# Patient Record
Sex: Female | Born: 1935 | Race: White | Hispanic: No | Marital: Married | Smoking: Former smoker
Health system: Southern US, Community
[De-identification: ages and names within clinical notes are randomized; demographics above are authoritative.]

## PROBLEM LIST (undated history)

## (undated) DIAGNOSIS — J40 Bronchitis, not specified as acute or chronic: Secondary | ICD-10-CM

## (undated) DIAGNOSIS — M1990 Unspecified osteoarthritis, unspecified site: Secondary | ICD-10-CM

## (undated) DIAGNOSIS — Z9989 Dependence on other enabling machines and devices: Secondary | ICD-10-CM

## (undated) DIAGNOSIS — R0602 Shortness of breath: Secondary | ICD-10-CM

## (undated) DIAGNOSIS — K449 Diaphragmatic hernia without obstruction or gangrene: Secondary | ICD-10-CM

## (undated) DIAGNOSIS — Z9981 Dependence on supplemental oxygen: Secondary | ICD-10-CM

## (undated) DIAGNOSIS — K219 Gastro-esophageal reflux disease without esophagitis: Secondary | ICD-10-CM

## (undated) DIAGNOSIS — L0390 Cellulitis, unspecified: Secondary | ICD-10-CM

## (undated) DIAGNOSIS — I10 Essential (primary) hypertension: Secondary | ICD-10-CM

## (undated) DIAGNOSIS — J189 Pneumonia, unspecified organism: Secondary | ICD-10-CM

## (undated) DIAGNOSIS — R0902 Hypoxemia: Secondary | ICD-10-CM

## (undated) HISTORY — PX: JOINT REPLACEMENT: SHX530

## (undated) HISTORY — PX: COLONOSCOPY: SHX174

## (undated) HISTORY — PX: CARPAL TUNNEL RELEASE: SHX101

## (undated) HISTORY — PX: HIP ARTHROPLASTY: SHX981

## (undated) HISTORY — PX: OVARIAN CYST REMOVAL: SHX89

## (undated) HISTORY — PX: BACK SURGERY: SHX140

## (undated) HISTORY — PX: ROTATOR CUFF REPAIR: SHX139

## (undated) HISTORY — PX: FRACTURE SURGERY: SHX138

## (undated) HISTORY — PX: VEIN SURGERY: SHX48

---

## 2008-12-19 DIAGNOSIS — R0902 Hypoxemia: Secondary | ICD-10-CM

## 2008-12-19 HISTORY — DX: Hypoxemia: R09.02

## 2011-09-22 ENCOUNTER — Other Ambulatory Visit: Payer: Self-pay | Admitting: Neurosurgery

## 2011-09-22 DIAGNOSIS — M545 Low back pain: Secondary | ICD-10-CM

## 2011-09-29 ENCOUNTER — Ambulatory Visit
Admission: RE | Admit: 2011-09-29 | Discharge: 2011-09-29 | Disposition: A | Discharge status: Home / Self Care | Payer: Medicare Other | Source: Ambulatory Visit | Attending: Neurosurgery | Admitting: Neurosurgery

## 2011-09-29 DIAGNOSIS — M545 Low back pain: Secondary | ICD-10-CM

## 2011-11-04 ENCOUNTER — Encounter (HOSPITAL_COMMUNITY): Payer: Self-pay | Admitting: Pharmacist

## 2011-11-07 ENCOUNTER — Encounter (HOSPITAL_COMMUNITY): Payer: Self-pay

## 2011-11-07 ENCOUNTER — Encounter (HOSPITAL_COMMUNITY)
Admission: RE | Admit: 2011-11-07 | Discharge: 2011-11-07 | Disposition: A | Discharge status: Home / Self Care | Payer: Medicare Other | Source: Ambulatory Visit | Attending: Anesthesiology | Admitting: Anesthesiology

## 2011-11-07 ENCOUNTER — Encounter (HOSPITAL_COMMUNITY)
Admission: RE | Admit: 2011-11-07 | Discharge: 2011-11-07 | Disposition: A | Discharge status: Home / Self Care | Payer: Medicare Other | Source: Ambulatory Visit | Attending: Neurosurgery | Admitting: Neurosurgery

## 2011-11-07 ENCOUNTER — Other Ambulatory Visit: Payer: Self-pay

## 2011-11-07 HISTORY — DX: Diaphragmatic hernia without obstruction or gangrene: K44.9

## 2011-11-07 HISTORY — DX: Gastro-esophageal reflux disease without esophagitis: K21.9

## 2011-11-07 HISTORY — DX: Hypoxemia: R09.02

## 2011-11-07 HISTORY — DX: Unspecified osteoarthritis, unspecified site: M19.90

## 2011-11-07 HISTORY — DX: Shortness of breath: R06.02

## 2011-11-07 HISTORY — DX: Essential (primary) hypertension: I10

## 2011-11-07 LAB — DIFFERENTIAL
Basophils Absolute: 0 10*3/uL (ref 0.0–0.1)
Basophils Relative: 1 % (ref 0–1)
Eosinophils Relative: 4 % (ref 0–5)
Monocytes Absolute: 0.7 10*3/uL (ref 0.1–1.0)
Monocytes Relative: 10 % (ref 3–12)

## 2011-11-07 LAB — SURGICAL PCR SCREEN
MRSA, PCR: NEGATIVE
Staphylococcus aureus: NEGATIVE

## 2011-11-07 LAB — ABO/RH: ABO/RH(D): A POS

## 2011-11-07 LAB — CBC
HCT: 36.9 % (ref 36.0–46.0)
MCHC: 32.5 g/dL (ref 30.0–36.0)
MCV: 95.1 fL (ref 78.0–100.0)
RDW: 12.9 % (ref 11.5–15.5)

## 2011-11-07 NOTE — Pre-Procedure Instructions (Signed)
20 Kristine [name]  11/07/2011   Your procedure is scheduled on:  11/14/2011 Monday  Report to [facility] Stay [facility] at 0530 AM.  Call this number if you have problems the morning of surgery: [phone]   Remember:   Do not eat food:After Midnight.  Do not drink clear liquids: 4 Hours before arrival.  Take these medicines the morning of surgery with A SIP OF WATER: Omeprazole,zoloft   Do not wear jewelry, make-up or nail polish.  Do not wear lotions, powders, or perfumes. You may wear deodorant.  Do not shave 48 hours prior to surgery.  Do not bring valuables to the hospital.  Contacts, dentures or bridgework may not be worn into surgery.  Leave suitcase in the car. After surgery it may be brought to your room.  For patients admitted to the hospital, checkout time is 11:00 AM the day of discharge.   Patients discharged the day of surgery will not be allowed to drive home.  Name and phone number of your driver: family  Special Instructions: CHG Shower Use Special Wash: 1/2 bottle night before surgery and 1/2 bottle morning of surgery.   Please read over the following fact sheets that you were given: Pain Booklet, Coughing and Deep Breathing, Blood Transfusion Information, MRSA Information and Surgical Site Infection Prevention

## 2011-11-14 ENCOUNTER — Inpatient Hospital Stay (HOSPITAL_COMMUNITY): Payer: Medicare Other

## 2011-11-14 ENCOUNTER — Inpatient Hospital Stay (HOSPITAL_COMMUNITY): Payer: Medicare Other | Admitting: Certified Registered"

## 2011-11-14 ENCOUNTER — Encounter (HOSPITAL_COMMUNITY): Payer: Self-pay | Admitting: Certified Registered"

## 2011-11-14 ENCOUNTER — Encounter (HOSPITAL_COMMUNITY): Payer: Self-pay | Admitting: *Deleted

## 2011-11-14 ENCOUNTER — Encounter (HOSPITAL_COMMUNITY)
Admission: RE | Disposition: A | Discharge status: Home / Self Care | Payer: Self-pay | Source: Ambulatory Visit | Attending: Neurosurgery

## 2011-11-14 ENCOUNTER — Inpatient Hospital Stay (HOSPITAL_COMMUNITY)
Admission: RE | Admit: 2011-11-14 | Discharge: 2011-11-18 | DRG: 457 | Disposition: A | Discharge status: Home / Self Care | Payer: Medicare Other | Source: Ambulatory Visit | Attending: Neurosurgery | Admitting: Neurosurgery

## 2011-11-14 DIAGNOSIS — K449 Diaphragmatic hernia without obstruction or gangrene: Secondary | ICD-10-CM | POA: Diagnosis present

## 2011-11-14 DIAGNOSIS — K219 Gastro-esophageal reflux disease without esophagitis: Secondary | ICD-10-CM | POA: Diagnosis present

## 2011-11-14 DIAGNOSIS — I10 Essential (primary) hypertension: Secondary | ICD-10-CM | POA: Diagnosis present

## 2011-11-14 DIAGNOSIS — M4180 Other forms of scoliosis, site unspecified: Principal | ICD-10-CM | POA: Diagnosis present

## 2011-11-14 DIAGNOSIS — D62 Acute posthemorrhagic anemia: Secondary | ICD-10-CM | POA: Diagnosis not present

## 2011-11-14 DIAGNOSIS — I9589 Other hypotension: Secondary | ICD-10-CM | POA: Diagnosis not present

## 2011-11-14 DIAGNOSIS — M4310 Spondylolisthesis, site unspecified: Secondary | ICD-10-CM | POA: Diagnosis present

## 2011-11-14 DIAGNOSIS — Z79899 Other long term (current) drug therapy: Secondary | ICD-10-CM

## 2011-11-14 DIAGNOSIS — E669 Obesity, unspecified: Secondary | ICD-10-CM | POA: Diagnosis present

## 2011-11-14 DIAGNOSIS — Z01812 Encounter for preprocedural laboratory examination: Secondary | ICD-10-CM

## 2011-11-14 DIAGNOSIS — Z96659 Presence of unspecified artificial knee joint: Secondary | ICD-10-CM

## 2011-11-14 DIAGNOSIS — M5137 Other intervertebral disc degeneration, lumbosacral region: Secondary | ICD-10-CM | POA: Diagnosis present

## 2011-11-14 DIAGNOSIS — J45909 Unspecified asthma, uncomplicated: Secondary | ICD-10-CM | POA: Diagnosis present

## 2011-11-14 DIAGNOSIS — M51379 Other intervertebral disc degeneration, lumbosacral region without mention of lumbar back pain or lower extremity pain: Secondary | ICD-10-CM | POA: Diagnosis present

## 2011-11-14 HISTORY — DX: Dependence on supplemental oxygen: Z99.81

## 2011-11-14 LAB — DIFFERENTIAL
Eosinophils Absolute: 0.1 10*3/uL (ref 0.0–0.7)
Lymphocytes Relative: 8 % — ABNORMAL LOW (ref 12–46)
Lymphs Abs: 1.4 10*3/uL (ref 0.7–4.0)
Monocytes Relative: 2 % — ABNORMAL LOW (ref 3–12)
Neutrophils Relative %: 89 % — ABNORMAL HIGH (ref 43–77)

## 2011-11-14 LAB — BASIC METABOLIC PANEL
BUN: 17 mg/dL (ref 6–23)
CO2: 23 mEq/L (ref 19–32)
Chloride: 108 mEq/L (ref 96–112)
Glucose, Bld: 182 mg/dL — ABNORMAL HIGH (ref 70–99)
Potassium: 5.4 mEq/L — ABNORMAL HIGH (ref 3.5–5.1)
Sodium: 138 mEq/L (ref 135–145)

## 2011-11-14 LAB — CBC
Hemoglobin: 9.6 g/dL — ABNORMAL LOW (ref 12.0–15.0)
MCH: 31.1 pg (ref 26.0–34.0)
Platelets: 231 10*3/uL (ref 150–400)
RBC: 3.09 MIL/uL — ABNORMAL LOW (ref 3.87–5.11)
WBC: 16.4 10*3/uL — ABNORMAL HIGH (ref 4.0–10.5)

## 2011-11-14 SURGERY — POSTERIOR LUMBAR FUSION 3 LEVEL
Anesthesia: General | Site: Spine Lumbar | Laterality: Bilateral | Wound class: Clean

## 2011-11-14 MED ORDER — ONDANSETRON HCL 4 MG/2ML IJ SOLN: 4.0000 mg | INTRAMUSCULAR | Status: DC | PRN

## 2011-11-14 MED ORDER — ONDANSETRON HCL 4 MG/2ML IJ SOLN
INTRAMUSCULAR | Status: DC | PRN
  Administered 2011-11-14 (×2): 4 mg via INTRAVENOUS

## 2011-11-14 MED ORDER — VITAMIN C 500 MG PO TABS
500.0000 mg | ORAL_TABLET | ORAL | Status: DC
  Administered 2011-11-15 – 2011-11-18 (×4): 500 mg via ORAL
  Filled 2011-11-14 (×5): qty 1

## 2011-11-14 MED ORDER — CYCLOBENZAPRINE HCL 10 MG PO TABS
10.0000 mg | ORAL_TABLET | Freq: Three times a day (TID) | ORAL | Status: DC | PRN
  Administered 2011-11-16 – 2011-11-17 (×3): 10 mg via ORAL
  Filled 2011-11-14 (×3): qty 1

## 2011-11-14 MED ORDER — THERA M PLUS PO TABS
1.0000 | ORAL_TABLET | ORAL | Status: DC
  Administered 2011-11-15 – 2011-11-18 (×4): 1 via ORAL
  Filled 2011-11-14 (×5): qty 1

## 2011-11-14 MED ORDER — ROCURONIUM BROMIDE 100 MG/10ML IV SOLN
INTRAVENOUS | Status: DC | PRN
  Administered 2011-11-14: 50 mg via INTRAVENOUS
  Administered 2011-11-14: 20 mg via INTRAVENOUS
  Administered 2011-11-14 (×4): 10 mg via INTRAVENOUS

## 2011-11-14 MED ORDER — DEXAMETHASONE SODIUM PHOSPHATE 4 MG/ML IJ SOLN
INTRAMUSCULAR | Status: DC | PRN
  Administered 2011-11-14: 4 mg via INTRAVENOUS

## 2011-11-14 MED ORDER — LACTATED RINGERS IV SOLN
INTRAVENOUS | Status: DC | PRN
  Administered 2011-11-14 (×3): via INTRAVENOUS

## 2011-11-14 MED ORDER — ALUM & MAG HYDROXIDE-SIMETH 400-400-40 MG/5ML PO SUSP
30.0000 mL | Freq: Four times a day (QID) | ORAL | Status: DC | PRN
  Administered 2011-11-15 – 2011-11-16 (×2): 30 mL via ORAL
  Filled 2011-11-14 (×2): qty 30

## 2011-11-14 MED ORDER — THROMBIN 20000 UNITS EX KIT
PACK | CUTANEOUS | Status: DC | PRN
  Administered 2011-11-14 (×3): 20000 [IU] via TOPICAL

## 2011-11-14 MED ORDER — HYDROMORPHONE HCL PF 1 MG/ML IJ SOLN
0.5000 mg | INTRAMUSCULAR | Status: DC | PRN
  Administered 2011-11-14: 1 mg via INTRAVENOUS
  Administered 2011-11-14: 0.5 mg via INTRAVENOUS
  Administered 2011-11-15 (×2): 1 mg via INTRAVENOUS
  Administered 2011-11-16: 0.5 mg via INTRAVENOUS
  Filled 2011-11-14 (×6): qty 1

## 2011-11-14 MED ORDER — ACETAMINOPHEN 325 MG PO TABS: 650.0000 mg | ORAL_TABLET | ORAL | Status: DC | PRN

## 2011-11-14 MED ORDER — MIDAZOLAM HCL 5 MG/5ML IJ SOLN
INTRAMUSCULAR | Status: DC | PRN
  Administered 2011-11-14: 1 mg via INTRAVENOUS

## 2011-11-14 MED ORDER — DOCUSATE SODIUM 100 MG PO CAPS
100.0000 mg | ORAL_CAPSULE | Freq: Two times a day (BID) | ORAL | Status: DC
  Administered 2011-11-14 – 2011-11-18 (×7): 100 mg via ORAL
  Filled 2011-11-14 (×9): qty 1

## 2011-11-14 MED ORDER — RAMIPRIL 10 MG PO CAPS
10.0000 mg | ORAL_CAPSULE | ORAL | Status: DC
  Administered 2011-11-15: 10 mg via ORAL
  Filled 2011-11-14 (×2): qty 1

## 2011-11-14 MED ORDER — MENTHOL 3 MG MT LOZG
1.0000 | LOZENGE | OROMUCOSAL | Status: DC | PRN
  Filled 2011-11-14: qty 9

## 2011-11-14 MED ORDER — SODIUM CHLORIDE 0.9 % IJ SOLN: 3.0000 mL | INTRAMUSCULAR | Status: DC | PRN

## 2011-11-14 MED ORDER — PANTOPRAZOLE SODIUM 40 MG IV SOLR
40.0000 mg | Freq: Every day | INTRAVENOUS | Status: DC
  Filled 2011-11-14: qty 40

## 2011-11-14 MED ORDER — PANTOPRAZOLE SODIUM 40 MG PO TBEC
40.0000 mg | DELAYED_RELEASE_TABLET | Freq: Every day | ORAL | Status: DC
  Administered 2011-11-15 – 2011-11-18 (×4): 40 mg via ORAL
  Filled 2011-11-14 (×3): qty 1

## 2011-11-14 MED ORDER — PROPOFOL 10 MG/ML IV EMUL
INTRAVENOUS | Status: DC | PRN
  Administered 2011-11-14: 180 mg via INTRAVENOUS

## 2011-11-14 MED ORDER — ACETAMINOPHEN 650 MG RE SUPP: 650.0000 mg | RECTAL | Status: DC | PRN

## 2011-11-14 MED ORDER — PHENOL 1.4 % MT LIQD: 1.0000 | OROMUCOSAL | Status: DC | PRN

## 2011-11-14 MED ORDER — CALCIUM CARB-CHOLECALCIFEROL 600-1000 MG-UNIT PO CAPS: 1.0000 | ORAL_CAPSULE | Freq: Two times a day (BID) | ORAL | Status: DC

## 2011-11-14 MED ORDER — GLYCOPYRROLATE 0.2 MG/ML IJ SOLN
INTRAMUSCULAR | Status: DC | PRN
  Administered 2011-11-14: .8 mg via INTRAVENOUS

## 2011-11-14 MED ORDER — SIMVASTATIN 40 MG PO TABS
40.0000 mg | ORAL_TABLET | Freq: Every day | ORAL | Status: DC
  Administered 2011-11-14 – 2011-11-17 (×4): 40 mg via ORAL
  Filled 2011-11-14 (×5): qty 1

## 2011-11-14 MED ORDER — SODIUM CHLORIDE 0.9 % IJ SOLN
3.0000 mL | Freq: Two times a day (BID) | INTRAMUSCULAR | Status: DC
  Administered 2011-11-14 – 2011-11-16 (×4): 3 mL via INTRAVENOUS

## 2011-11-14 MED ORDER — CALCIUM CARBONATE-VITAMIN D 500-200 MG-UNIT PO TABS
1.0000 | ORAL_TABLET | Freq: Two times a day (BID) | ORAL | Status: DC
  Administered 2011-11-14 – 2011-11-18 (×8): 1 via ORAL
  Filled 2011-11-14 (×10): qty 1

## 2011-11-14 MED ORDER — PHENYLEPHRINE HCL 10 MG/ML IJ SOLN
INTRAMUSCULAR | Status: DC | PRN
  Administered 2011-11-14: 120 ug via INTRAVENOUS
  Administered 2011-11-14 (×3): 80 ug via INTRAVENOUS
  Administered 2011-11-14: 120 ug via INTRAVENOUS
  Administered 2011-11-14 (×2): 80 ug via INTRAVENOUS

## 2011-11-14 MED ORDER — SUFENTANIL CITRATE 50 MCG/ML IV SOLN
INTRAVENOUS | Status: DC | PRN
  Administered 2011-11-14: 10 ug via INTRAVENOUS
  Administered 2011-11-14: 15 ug via INTRAVENOUS
  Administered 2011-11-14 (×4): 10 ug via INTRAVENOUS
  Administered 2011-11-14: 5 ug via INTRAVENOUS
  Administered 2011-11-14 (×2): 10 ug via INTRAVENOUS

## 2011-11-14 MED ORDER — VANCOMYCIN HCL 1000 MG IV SOLR
1000.0000 mg | INTRAVENOUS | Status: DC | PRN
  Administered 2011-11-14: 1 g via INTRAVENOUS

## 2011-11-14 MED ORDER — HYDROMORPHONE HCL PF 1 MG/ML IJ SOLN
0.2500 mg | INTRAMUSCULAR | Status: DC | PRN
  Administered 2011-11-14 (×2): 0.5 mg via INTRAVENOUS

## 2011-11-14 MED ORDER — HEMOSTATIC AGENTS (NO CHARGE) OPTIME
TOPICAL | Status: DC | PRN
  Administered 2011-11-14 (×3): 1 via TOPICAL

## 2011-11-14 MED ORDER — SODIUM CHLORIDE 0.9 % IR SOLN
Status: DC | PRN
  Administered 2011-11-14: 1000 mL

## 2011-11-14 MED ORDER — POTASSIUM CHLORIDE IN NACL 20-0.45 MEQ/L-% IV SOLN
INTRAVENOUS | Status: DC
  Administered 2011-11-14: 17:00:00 via INTRAVENOUS
  Filled 2011-11-14 (×2): qty 1000

## 2011-11-14 MED ORDER — HETASTARCH-ELECTROLYTES 6 % IV SOLN
INTRAVENOUS | Status: DC | PRN
  Administered 2011-11-14: 10:00:00 via INTRAVENOUS

## 2011-11-14 MED ORDER — SODIUM CHLORIDE 0.9 % IV SOLN
250.0000 mL | INTRAVENOUS | Status: DC
  Administered 2011-11-16: 250 mL via INTRAVENOUS

## 2011-11-14 MED ORDER — BUPIVACAINE HCL (PF) 0.25 % IJ SOLN
INTRAMUSCULAR | Status: DC | PRN
  Administered 2011-11-14: 20 mL

## 2011-11-14 MED ORDER — VANCOMYCIN HCL 1000 MG IV SOLR
750.0000 mg | Freq: Two times a day (BID) | INTRAVENOUS | Status: DC
  Administered 2011-11-14 – 2011-11-15 (×3): 750 mg via INTRAVENOUS
  Filled 2011-11-14 (×3): qty 750

## 2011-11-14 MED ORDER — SERTRALINE HCL 50 MG PO TABS
50.0000 mg | ORAL_TABLET | ORAL | Status: DC
  Administered 2011-11-15 – 2011-11-18 (×4): 50 mg via ORAL
  Filled 2011-11-14 (×5): qty 1

## 2011-11-14 MED ORDER — ESMOLOL HCL 10 MG/ML IV SOLN
INTRAVENOUS | Status: DC | PRN
  Administered 2011-11-14: 20 mg via INTRAVENOUS

## 2011-11-14 MED ORDER — OXYCODONE-ACETAMINOPHEN 5-325 MG PO TABS
1.0000 | ORAL_TABLET | ORAL | Status: DC | PRN
Start: 2011-11-14 — End: 2011-11-18
  Administered 2011-11-14 – 2011-11-15 (×3): 2 via ORAL
  Filled 2011-11-14 (×3): qty 2

## 2011-11-14 MED ORDER — HYDROCODONE-ACETAMINOPHEN 5-325 MG PO TABS
2.0000 | ORAL_TABLET | ORAL | Status: DC | PRN
  Administered 2011-11-15 – 2011-11-17 (×5): 2 via ORAL
  Filled 2011-11-14 (×5): qty 2

## 2011-11-14 MED ORDER — BACITRACIN 50000 UNITS IM SOLR
INTRAMUSCULAR | Status: DC | PRN
  Administered 2011-11-14: 08:00:00

## 2011-11-14 MED ORDER — LIDOCAINE-EPINEPHRINE 1 %-1:100000 IJ SOLN
INTRAMUSCULAR | Status: DC | PRN
  Administered 2011-11-14: 17 mL via INTRADERMAL

## 2011-11-14 MED ORDER — WHITE PETROLATUM GEL
Status: AC
  Administered 2011-11-14: 21:00:00
  Filled 2011-11-14: qty 5

## 2011-11-14 MED ORDER — ALBUTEROL SULFATE (2.5 MG/3ML) 0.083% IN NEBU
INHALATION_SOLUTION | RESPIRATORY_TRACT | Status: DC | PRN
  Administered 2011-11-14: .45 mg via RESPIRATORY_TRACT

## 2011-11-14 MED ORDER — NEOSTIGMINE METHYLSULFATE 1 MG/ML IJ SOLN
INTRAMUSCULAR | Status: DC | PRN
  Administered 2011-11-14: 5 mg via INTRAVENOUS

## 2011-11-14 SURGICAL SUPPLY — 77 items
BAG DECANTER FOR FLEXI CONT (MISCELLANEOUS) ×2 IMPLANT
BENZOIN TINCTURE PRP APPL 2/3 (GAUZE/BANDAGES/DRESSINGS) ×2 IMPLANT
BLADE SURG 11 STRL SS (BLADE) ×2 IMPLANT
BLADE SURG ROTATE 9660 (MISCELLANEOUS) IMPLANT
BRUSH SCRUB EZ PLAIN DRY (MISCELLANEOUS) ×2 IMPLANT
BUR MATCHSTICK NEURO 3.0 LAGG (BURR) ×2 IMPLANT
BUR PRECISION FLUTE 6.0 (BURR) ×2 IMPLANT
CANISTER SUCTION 2500CC (MISCELLANEOUS) ×2 IMPLANT
CLOTH BEACON ORANGE TIMEOUT ST (SAFETY) ×2 IMPLANT
CONT SPEC 4OZ CLIKSEAL STRL BL (MISCELLANEOUS) ×4 IMPLANT
COVER BACK TABLE 24X17X13 BIG (DRAPES) ×2 IMPLANT
COVER TABLE BACK 60X90 (DRAPES) IMPLANT
CROSSLINK 58-70MM (Connector) ×2 IMPLANT
DECANTER SPIKE VIAL GLASS SM (MISCELLANEOUS) ×2 IMPLANT
DERMABOND ADVANCED (GAUZE/BANDAGES/DRESSINGS) ×1
DERMABOND ADVANCED .7 DNX12 (GAUZE/BANDAGES/DRESSINGS) ×1 IMPLANT
DRAPE C-ARM 42X72 X-RAY (DRAPES) ×4 IMPLANT
DRAPE LAPAROTOMY 100X72X124 (DRAPES) ×2 IMPLANT
DRAPE POUCH INSTRU U-SHP 10X18 (DRAPES) ×2 IMPLANT
DRAPE PROXIMA HALF (DRAPES) IMPLANT
DRAPE SURG 17X23 STRL (DRAPES) ×2 IMPLANT
DRSG OPSITE 4X5.5 SM (GAUZE/BANDAGES/DRESSINGS) ×6 IMPLANT
ELECT REM PT RETURN 9FT ADLT (ELECTROSURGICAL) ×2
ELECTRODE REM PT RTRN 9FT ADLT (ELECTROSURGICAL) ×1 IMPLANT
EVACUATOR 3/16  PVC DRAIN (DRAIN) ×1
EVACUATOR 3/16 PVC DRAIN (DRAIN) ×1 IMPLANT
GAUZE SPONGE 4X4 12PLY STRL LF (GAUZE/BANDAGES/DRESSINGS) ×2 IMPLANT
GAUZE SPONGE 4X4 16PLY XRAY LF (GAUZE/BANDAGES/DRESSINGS) ×4 IMPLANT
GLOVE BIO SURGEON STRL SZ8 (GLOVE) ×4 IMPLANT
GLOVE BIOGEL PI IND STRL 7.5 (GLOVE) ×2 IMPLANT
GLOVE BIOGEL PI IND STRL 8.5 (GLOVE) ×3 IMPLANT
GLOVE BIOGEL PI INDICATOR 7.5 (GLOVE) ×2
GLOVE BIOGEL PI INDICATOR 8.5 (GLOVE) ×3
GLOVE ECLIPSE 7.5 STRL STRAW (GLOVE) ×2 IMPLANT
GLOVE ECLIPSE 8.5 STRL (GLOVE) ×2 IMPLANT
GLOVE EXAM NITRILE LRG STRL (GLOVE) IMPLANT
GLOVE EXAM NITRILE MD LF STRL (GLOVE) ×4 IMPLANT
GLOVE EXAM NITRILE XL STR (GLOVE) IMPLANT
GLOVE EXAM NITRILE XS STR PU (GLOVE) IMPLANT
GLOVE INDICATOR 8.5 STRL (GLOVE) ×4 IMPLANT
GLOVE SS BIOGEL STRL SZ 7 (GLOVE) ×1 IMPLANT
GLOVE SUPERSENSE BIOGEL SZ 7 (GLOVE) ×1
GLOVE SURG SS PI 8.0 STRL IVOR (GLOVE) ×8 IMPLANT
GOWN BRE IMP SLV AUR LG STRL (GOWN DISPOSABLE) ×4 IMPLANT
GOWN BRE IMP SLV AUR XL STRL (GOWN DISPOSABLE) ×4 IMPLANT
GOWN STRL REIN 2XL LVL4 (GOWN DISPOSABLE) ×6 IMPLANT
KIT BASIN OR (CUSTOM PROCEDURE TRAY) ×2 IMPLANT
KIT INFUSE MEDIUM (Orthopedic Implant) ×2 IMPLANT
KIT ROOM TURNOVER OR (KITS) ×2 IMPLANT
MASTERGRAFT STRIP 10CM ×2 IMPLANT
MATRIX MASTERGRAFT STRIP 10CM ×1 IMPLANT
NEEDLE HYPO 25X1 1.5 SAFETY (NEEDLE) ×2 IMPLANT
NS IRRIG 1000ML POUR BTL (IV SOLUTION) ×2 IMPLANT
PACK LAMINECTOMY NEURO (CUSTOM PROCEDURE TRAY) ×2 IMPLANT
PAD ARMBOARD 7.5X6 YLW CONV (MISCELLANEOUS) ×12 IMPLANT
PATTIES SURGICAL 1X1 (DISPOSABLE) ×2 IMPLANT
PUTTY 5ML ACTIFUSE ABX (Putty) ×2 IMPLANT
ROD REVERE 6.35 STRAIGHT 125MM (Rod) ×4 IMPLANT
SCREW REVERE 6.35 6.5MMX45 (Screw) ×2 IMPLANT
SCREW REVERE 6.35 6.5X35MM (Screw) ×4 IMPLANT
SCREW REVERE 6.35 6.5X40MM (Screw) ×14 IMPLANT
SLEEVE SURGEON STRL (DRAPES) ×2 IMPLANT
SPONGE GAUZE 4X4 12PLY (GAUZE/BANDAGES/DRESSINGS) ×2 IMPLANT
SPONGE LAP 4X18 X RAY DECT (DISPOSABLE) ×2 IMPLANT
SPONGE SURGIFOAM ABS GEL 100 (HEMOSTASIS) ×6 IMPLANT
STRIP CLOSURE SKIN 1/2X4 (GAUZE/BANDAGES/DRESSINGS) ×2 IMPLANT
SUT VIC AB 0 CT1 18XCR BRD8 (SUTURE) ×2 IMPLANT
SUT VIC AB 0 CT1 8-18 (SUTURE) ×2
SUT VIC AB 2-0 CT1 18 (SUTURE) ×4 IMPLANT
SUT VICRYL 4-0 PS2 18IN ABS (SUTURE) ×2 IMPLANT
SYR 20ML ECCENTRIC (SYRINGE) ×2 IMPLANT
TELAMON 22X10 (Cage) ×8 IMPLANT
TOWEL OR 17X24 6PK STRL BLUE (TOWEL DISPOSABLE) ×2 IMPLANT
TOWEL OR 17X26 10 PK STRL BLUE (TOWEL DISPOSABLE) ×4 IMPLANT
TRAY FOLEY CATH 14FRSI W/METER (CATHETERS) ×2 IMPLANT
WATER STERILE IRR 1000ML POUR (IV SOLUTION) ×2 IMPLANT
WEDGE TANGENT 10X26MM ×8 IMPLANT

## 2011-11-14 NOTE — Progress Notes (Signed)
Pt. To [facility]cu with nasal trumpet in place in rt nare.

## 2011-11-14 NOTE — H&P (Signed)
Kristine [name] is an 77 y.o. female.   Chief Complaint:  Back and left greater right leg pain HPI:  Patient has had long-standing back and bilateral leg pain worse on the left for several years. She has pain that radiates into both thighs occasionally sometimes even worse on the right she's had intermittent numbness of both feet.  She denies any bowel bladder trouble. She is undergone to epidurals the first one gave her some significant relief but the second did not help her at all. The pain has gotten much worse slightly worse when she is standing better when she is lying down she claudication only about half a block. And she's noticed some progressive weakness in the left leg.  Past Medical History  Diagnosis Date  . Oxygen decrease 2010    during colonoscopy and elbow surgery 4 yrs ago.   [name] Hypertension   . Asthma   . Shortness of breath   . GERD (gastroesophageal reflux disease)   . Hiatal hernia   . Arthritis   . On home oxygen therapy     at night    Past Surgical History  Procedure Date  . Ovarian cyst removal 50's  . Fracture surgery   . Joint replacement right knee  . Carpal tunnel release 80's    History reviewed. No pertinent family history. Social History:  does not have a smoking history on file. She does not have any smokeless tobacco history on file. She reports that she does not drink alcohol or use illicit drugs.  Allergies:  Allergies  Allergen Reactions  . Advair Diskus (Fluticasone-Salmeterol) Other (See Comments)    HR and BP go up  . Relafen (Nabumetone) Nausea Only  . Amoxicillin (Amoxil) Rash    No current facility-administered medications on file as of 11/14/2011.   Medications Prior to Admission  Medication Sig Dispense Refill  . HYDROcodone-acetaminophen (VICODIN) 5-500 MG per tablet Take 1 tablet by mouth every 4 (four) hours as needed.          No results found for this or any previous visit (from the past 48 hour(s)). No results  found.  Review of Systems  Constitutional: Negative.   HENT: Negative.   Eyes: Negative.   Respiratory: Positive for shortness of breath.   Cardiovascular: Negative.   Gastrointestinal: Negative.   Genitourinary: Negative.   Musculoskeletal: Positive for myalgias and back pain.  Skin: Negative.   Psychiatric/Behavioral: Negative.     Blood pressure 156/79, pulse 72, temperature 98.7 F (37.1 C), temperature source Oral, resp. rate 18, SpO2 95.00%. Physical Exam  Constitutional: She appears well-developed.  HENT:  Head: Normocephalic.  Eyes: Pupils are equal, round, and reactive to light.  Neck: Normal range of motion.  Cardiovascular: Normal rate.   GI: Soft.  Neurological: A sensory deficit is present.  Reflex Scores:      Patellar reflexes are 0 on the right side and 0 on the left side.      Achilles reflexes are 0 on the right side and 0 on the left side.       The patient is awake alert , upper extremity Strength is 5 of 5. Right lower extremity is 5 out of 5 in her iliopsoas, quads, hamstrings, gastrocs, EHL and anterior tibialis. Left lower extremity is 4/5 in her iliopsoas and quads 5 out of 5 in her hamstrings gastrocs EHL and anterior tibialis.     Assessment/Plan  77 year old female with severe degenerative lumbar spinal stenosis and degenerative lumbar scoliosis.  Patient is refractory all forms of conservative treatment and is developed proximal leg weakness in the left   Lower extremity. Her CT scan showed severe scoliotic deformities extending from L2 down to L5 I. As well as over the 51 disc space. The CT scan also gives an appearance of pars defects on the left at L5. I recommended decompression stabilization procedure from L2 down L5 with possibility of having incorporate the L5-S1 disc space level. Secondary to the pars defects , this may be too unstable to leave behind. I have extensively gone over the risks of surgery, perioperative course, expectations of outcome,  and alternatives to surgery. The patient understands these and agrees to proceed forward.  Milderd Manocchio P 11/14/2011, 7:31 AM

## 2011-11-14 NOTE — Preoperative (Signed)
Beta Blockers   Reason not to administer Beta Blockers:Not Applicable 

## 2011-11-14 NOTE — Anesthesia Preprocedure Evaluation (Addendum)
Anesthesia Evaluation  Patient identified by MRN, date of birth, ID band Patient awake    Reviewed: Allergy & Precautions, H&P , NPO status , Patient's Chart, lab work & pertinent test results  Airway Mallampati: I TM Distance: >3 FB Neck ROM: Full    Dental  (+) Upper Dentures   Pulmonary asthma , Current Smoker,  O2 at night time, uses inhaler prn clear to auscultation+ rhonchi  + decreased breath sounds      Cardiovascular hypertension, Pt. on medications Regular     Neuro/Psych Back, Bilateral leg pain    GI/Hepatic GERD-  Medicated,  Endo/Other  Morbid obesity  Renal/GU      Musculoskeletal   Abdominal (+) obese,   Peds  Hematology   Anesthesia Other Findings cough  Reproductive/Obstetrics                          Anesthesia Physical Anesthesia Plan  ASA: III  Anesthesia Plan: General   Post-op Pain Management:    Induction: Intravenous  Airway Management Planned: Oral ETT  Additional Equipment:   Intra-op Plan:   Post-operative Plan: Extubation in OR  Informed Consent: I have reviewed the patients History and Physical, chart, labs and discussed the procedure including the risks, benefits and alternatives for the proposed anesthesia with the patient or authorized representative who has indicated his/her understanding and acceptance.   Dental advisory given  Plan Discussed with: CRNA, Anesthesiologist and Surgeon  Anesthesia Plan Comments:         Anesthesia Quick Evaluation

## 2011-11-14 NOTE — Transfer of Care (Signed)
Immediate Anesthesia Transfer of Care Note  Patient: Kristine [name]  Procedure(s) Performed:  POSTERIOR LUMBAR FUSION 3 LEVEL - Lumbar Two-Three,Lumbar Three-four,Lumbar Four-Five , Lumbar Five-Sacral One Posterior Lumbar Interbody Fuion  Patient Location: PACU  Anesthesia Type: General  Level of Consciousness: awake, alert  and patient cooperative  Airway & Oxygen Therapy: Patient Spontanous Breathing and Patient connected to face mask oxygen  Post-op Assessment: Report given to PACU RN  Post vital signs: Reviewed and stable  Complications: No apparent anesthesia complications

## 2011-11-14 NOTE — Op Note (Signed)
Preoperative diagnosis: Lumbar degenerative disc disease and lumbar degenerative scoliosis L2-S1  Postoperative diagnosis: Same  Procedure: Decompressive lumbar laminectomies and posterior lumbar interbody fusion at L2-3, L3-4, L4-5, and L5-S1. Pedicle screw fixation L4-S1 using the globus Revere pedicle screw system. Posterior lateral arthrodesis L2-S1 using locally harvested our graft mixed with active use, Master graft, and BMP. Open reduction of spinal deformity L2-S1. Placed in a large Hemovac drain  Surgeon: Jillyn Hidden Loye Reininger  Assistant: Sherilyn Cooter pool  Anesthesia: Gen.  EBL: 800 cc with 350 Cell Saver given back   history of present illness: Patient is a very pleasant 77 year old female has had long-standing back and bilateral leg pain that's been through physical therapy epidural steroid injections in the last several months and years ago progressively worse she develops a proximal leg weakness in the left lower extremity. MRI scan subsequent CT scan showed significant number spinal stenosis from L2 down to S1 with severe degenerative scoliosis at these levels as well. To this mixture of the treatment progression of clinical exam MRI and CT, she was recommended decompression stabilization procedure 1 of the risks and benefits of the operation with her as well as the perioperative course and expectations of outcome alternatives to surgery and she understands and agrees to proceed forward.  Operative procedure: Patient was brought into the or was induced under general anesthesia and positioned prone on the Wilson frame. Her back was prepped and draped in routine sterile fashion after infiltration of 20 cc lidocaine with epi a midline incision was made and Bovie light cautery was used to gas exchange tissues and subperiosteal dissections care on the lamina from L2 down to S1. TPs were exposed at all these levels and interoperative x-ray identified the appropriate level. The facet complex at L5-S1 was  immediate inspected as preoperatively this been noted to be potentially incompetent. The pars defect was immediately identified on the left side at this level so it was felt this space need to be incorporated into the fusion from L2 down L5. At this point a high-speed drill was used to drill down the medial facet complexes at C3, 34, 45, and 51. Then the spinous processes at L2 L3-L4 and L5 with a removed central decompression was begun marked facet arthropathy was causing severe hourglass complete decompression of the thecal sac at all these levels. The facet complexes were noted be diastased incompetent as well as it was readily apparent degree of her instability and scoliosis upon exposure. After central decompression been completed complete medial facetectomies were performed at 23, L3-4, L4-5, and L5-S1. Extensive foraminotomies were carried out on the L2-L3 L4-L5 and S1 nerve roots after the decompression been completed consisting of pedicle screw placement using a high-speed drill pilot holes were drilled L2 on the right, the pedicle was cannulated with the awl, was probed, 55, and a 6 x 45 screw inserted initially L2 on the right. Then in a similar fashion 05/24/1937 screws were inserted at L3 L4-L5 on the right and 6.5 x 35 at S1. On the left side and in similar fashion 6.5 x 40 screws were inserted at L2 L3-L4 and L5 and a 6 5 x 35 at S1. After all screws in place dictated attention was taken to the interbody work. A draped was used to reflect the left S1 nerve root medially epidural veins granulated disc space was incised and cleanout pituitary rongeurs the initially using a size 8 distractor this was inserted and the space this helped open up the L5-S1 disc space  which was markedly collapsed on the left side and this disc spaces of the hypermobile loose and is distractor was seen on fluoroscopy to be 2 small cysts stepped up to 10 distractor. Then in addition I distractor was inserted at L4-5 and 11  distractor was inserted here and this was felt also to be appropriate sizing for the graft to both results be 10 mm x 20 mm respectively. The contralateralwere then cleaned out and using a size 10 cutter and chisel endplates were prepared prepped using Epstein curettes the central disc was scraped and removed and a a Telamon to my 22 mm peek cage was inserted and the left L5-S1 and day on the right L4-5. Tangent allograft wedges were inserted contralaterally centrally was packed a BMP sponge as well as the locally harvested autograft mixed with activities. Placement of the interbody implants significantly reduce the deformity L5-S1. After only a by Karie Chimera 455 one to 2 seconds 2334 in a similar fashion disc spaces cleanout was size 10 distractor was inserted each level using size 10 rotating cutter and chisel in place a prepped to receive the implant. Then with Telamon peek cage and tangent graft wedge on opposite sides at each level and local graft mixed with BMP activities was packed centrally this also significantly reduce the deformity and scoliosis these levels. All the foraminal reinspected to confirm patency by open with a coronary.her and after all interbody work been done to was in to proceed irrigated meticulous hemostasis was maintained aggressive decortication was carried TPs lateral gutters. BMP autograft activities as intact posterior laterally as well as being augmented with a Master Graf sponge. Then 2 rods were contoured and placed all months for locked in place with the S1 top tightening that locked down L5 compressing his S1-L4 compressing the L5-L3 compressing at L4 and L2 compressing is to 3. Across it was applied ulnar from within and within reexplored and Gelfoam was laid over the dura with a large Hemovac drain placed. After fluoroscopy confirmed good position of the implants insignificant correction of her scoliotic deformity., The wound was then closed with interrupted Vicryl as a  running 4 subcuticular and skin benzoin and Steri-Strips applied patient recovered in stable condition at the end of case all and sponge counts were correct.

## 2011-11-14 NOTE — Anesthesia Procedure Notes (Signed)
Procedure Name: Intubation Date/Time: 11/14/2011 7:54 AM Performed by: Glendora Score Pre-anesthesia Checklist: Patient identified, Emergency Drugs available, Suction available and Patient being monitored Patient Re-evaluated:Patient Re-evaluated prior to inductionOxygen Delivery Method: Circle System Utilized Preoxygenation: Pre-oxygenation with 100% oxygen Intubation Type: IV induction Ventilation: Mask ventilation without difficulty and Oral airway inserted - appropriate to patient size Laryngoscope Size: Hyacinth Meeker and 2 Grade View: Grade I Tube type: Oral Tube size: 7.5 mm Number of attempts: 1 Airway Equipment and Method: stylet Placement Confirmation: ETT inserted through vocal cords under direct vision,  positive ETCO2 and breath sounds checked- equal and bilateral Secured at: 21 cm Tube secured with: Tape Dental Injury: Teeth and Oropharynx as per pre-operative assessment

## 2011-11-14 NOTE — Progress Notes (Signed)
Nasal trumpet removed.

## 2011-11-14 NOTE — Progress Notes (Signed)
ANTIBIOTIC CONSULT NOTE - INITIAL  Pharmacy Consult for Vancomycin Indication: Empiric therapy post Spinal Surgery  Allergies  Allergen Reactions  . Advair Diskus (Fluticasone-Salmeterol) Other (See Comments)    HR and BP go up  . Relafen (Nabumetone) Nausea Only  . Amoxicillin (Amoxil) Rash    Patient Measurements:   Total Body weight: 103kg (11/07/11)  Vital Signs: Temp: 97.2 F (36.2 C) (11/26 1415) Temp src: Oral (11/26 0610) BP: 116/96 mmHg (11/26 1435) Pulse Rate: 108  (11/26 1515) Intake/Output from previous day:   Intake/Output from this shift: Total I/O In: 3525 [I.V.:2700; Blood:325; IV Piggyback:500] Out: 1450 [Urine:650; Blood:800]  Labs: No results found for this basename: WBC:3,HGB:3,PLT:3,LABCREA:3,CREATININE:3 in the last 72 hours CrCl is unknown because no creatinine reading has been taken and the patient has no height on file. No results found for this basename: VANCOTROUGH:2,VANCOPEAK:2,VANCORANDOM:2,GENTTROUGH:2,GENTPEAK:2,GENTRANDOM:2,TOBRATROUGH:2,TOBRAPEAK:2,TOBRARND:2,AMIKACINPEAK:2,AMIKACINTROU:2,AMIKACIN:2, in the last 72 hours   Microbiology: Recent Results (from the past 720 hour(s))  SURGICAL PCR SCREEN     Status: Normal   Collection Time   11/07/11 12:35 PM      Component Value Range Status Comment   MRSA, PCR NEGATIVE  NEGATIVE  Final    Staphylococcus aureus NEGATIVE  NEGATIVE  Final     Medical History: Past Medical History  Diagnosis Date  . Oxygen decrease 2010    during colonoscopy and elbow surgery 4 yrs ago.   [name] Hypertension   . Asthma   . Shortness of breath   . GERD (gastroesophageal reflux disease)   . Hiatal hernia   . Arthritis   . On home oxygen therapy     at night    Medications:  Prescriptions prior to admission  Medication Sig Dispense Refill  . Calcium Carb-Cholecalciferol [phone] MG-UNIT CAPS Take 1 tablet by mouth 2 (two) times daily.        . Glucosamine HCl-Glucosamin SO4 (GLUCOSAMINE COMPLEX PO)  Take 2,000 mg by mouth 2 (two) times daily.        . GuaiFENesin (QC MEDIFIN 400 PO) Take 2 tablets by mouth 2 (two) times daily.        [name] HYDROcodone-acetaminophen (VICODIN) 5-500 MG per tablet Take 1 tablet by mouth every 4 (four) hours as needed.        [name] omeprazole (PRILOSEC) 40 MG capsule Take 40 mg by mouth every morning.        . polyethylene glycol powder (GLYCOLAX/MIRALAX) powder Take 17 g by mouth daily as needed. For constipation       . ramipril (ALTACE) 10 MG capsule Take 10 mg by mouth every morning.        . sertraline (ZOLOFT) 50 MG tablet Take 50 mg by mouth every morning.        . simvastatin (ZOCOR) 40 MG tablet Take 40 mg by mouth at bedtime.        . vitamin C (ASCORBIC ACID) 500 MG tablet Take 500 mg by mouth every morning.        [name] DISCONTD: aspirin EC 81 MG tablet Take 81 mg by mouth every morning.        [name] DISCONTD: Garlic 1000 MG CAPS Take 1 capsule by mouth daily.        [name] acetaminophen (TYLENOL) 500 MG tablet Take 1,000 mg by mouth every 4 (four) hours as needed. For leg burning       . meloxicam (MOBIC) 7.5 MG tablet Take 7.5 mg by mouth every morning.        [name]  Multiple Vitamins-Minerals (MULTIVITAMINS THER. W/MINERALS) TABS Take 1 tablet by mouth every morning.        [name] OMEGA 3-6-9 FATTY ACIDS PO Take 1 capsule by mouth 2 (two) times daily.         Assessment: 77 yo obese female s/p L2- L5 decompression stabilization procedure, received 1g Vancomycin at 0757 this morning before surgery.  To continue vancomycin until hemovac discontinued.  Goal of Therapy:  Vancomycin trough level 15-20 mcg/ml  Plan:  1. Vancomycin 750 mg IV q12h next dose due at 6pm. 2. Follow up SCr, UOP, cultures, clinical course and adjust as clinically indicated.  Lovenia Kim Pharm.D., BCPS 11/14/2011 3:25 PM [phone]

## 2011-11-14 NOTE — Anesthesia Postprocedure Evaluation (Signed)
  Anesthesia Post-op Note  Patient: Kristine [name]  Procedure(s) Performed:  POSTERIOR LUMBAR FUSION 3 LEVEL - Lumbar Two-Three,Lumbar Three-four,Lumbar Four-Five , Lumbar Five-Sacral One Posterior Lumbar Interbody Fuion  Patient Location: PACU  Anesthesia Type: General  Level of Consciousness: awake, alert  and oriented  Airway and Oxygen Therapy: Patient Spontanous Breathing  Post-op Pain: mild  Post-op Assessment: Post-op Vital signs reviewed, Patient's Cardiovascular Status Stable, Respiratory Function Stable, Patent Airway, No signs of Nausea or vomiting and Pain level controlled  Post-op Vital Signs: stable  Complications: No apparent anesthesia complications

## 2011-11-15 DIAGNOSIS — R34 Anuria and oliguria: Secondary | ICD-10-CM

## 2011-11-15 DIAGNOSIS — I959 Hypotension, unspecified: Secondary | ICD-10-CM

## 2011-11-15 DIAGNOSIS — D6489 Other specified anemias: Secondary | ICD-10-CM

## 2011-11-15 LAB — BASIC METABOLIC PANEL
Calcium: 7.7 mg/dL — ABNORMAL LOW (ref 8.4–10.5)
GFR calc non Af Amer: 41 mL/min — ABNORMAL LOW (ref >90)
Sodium: 136 mEq/L (ref 135–145)

## 2011-11-15 LAB — CREATININE, URINE, RANDOM: Creatinine, Urine: 172.39 mg/dL

## 2011-11-15 LAB — SODIUM, URINE, RANDOM: Sodium, Ur: <10 mEq/L

## 2011-11-15 MED ORDER — SODIUM CHLORIDE 0.9 % IV BOLUS (SEPSIS)
500.0000 mL | Freq: Once | INTRAVENOUS | Status: AC
  Administered 2011-11-15: 500 mL via INTRAVENOUS

## 2011-11-15 MED ORDER — SODIUM CHLORIDE 0.45 % IV SOLN: INTRAVENOUS | Status: DC

## 2011-11-15 MED ORDER — POLYETHYLENE GLYCOL 3350 17 G PO PACK
17.0000 g | PACK | Freq: Every day | ORAL | Status: DC
  Administered 2011-11-15 – 2011-11-18 (×4): 17 g via ORAL
  Filled 2011-11-15 (×6): qty 1

## 2011-11-15 MED ORDER — SODIUM CHLORIDE 0.9 % IV BOLUS (SEPSIS)
1000.0000 mL | Freq: Once | INTRAVENOUS | Status: AC
  Administered 2011-11-15: 1000 mL via INTRAVENOUS

## 2011-11-15 MED ORDER — SODIUM CHLORIDE 0.9 % IV SOLN
INTRAVENOUS | Status: DC
  Administered 2011-11-15: 125 mL via INTRAVENOUS
  Administered 2011-11-16: 03:00:00 via INTRAVENOUS

## 2011-11-15 MED FILL — Sodium Chloride Irrigation Soln 0.9%: Qty: 500 | Status: AC

## 2011-11-15 NOTE — Progress Notes (Signed)
ANTIBIOTIC CONSULT NOTE - FOLLOW UP Pharmacy Consult for Vancomycin Indication: Empiric therapy post Spinal Surgery  Allergies  Allergen Reactions  . Advair Diskus (Fluticasone-Salmeterol) Other (See Comments)    HR and BP go up  . Relafen (Nabumetone) Nausea Only  . Amoxicillin (Amoxil) Rash   Assessment: 77 yo obese female s/p L2- L5 decompression stabilization procedure, received 1g Vancomycin at 0757 this morning before surgery.  To continue vancomycin until hemovac discontinued.  In last 24h patient has experienced hypotension and ongoing low UOP.  Goal of Therapy:  Vancomycin trough level 15-20 mcg/ml  Plan:  1. DC standing vancomycin q12h order, check vancomycin trough concentration with AM Labs 11/28 anticipate changing dose to 1500 mg IV q24 or less frequently based on assessment of renal function in the am 2. Follow up SCr, UOP, cultures, clinical course and adjust as clinically indicated.  Patient Measurements: Height: 5\' 6"  (167.6 cm) Weight: 239 lb 6.7 oz (108.6 kg) IBW/kg (Calculated) : 59.3  Total Body weight: 103kg (11/07/11)  Vital Signs: Temp: 98.9 F (37.2 C) (11/27 1600) Temp src: Oral (11/27 1600) BP: 83/51 mmHg (11/27 1525) Pulse Rate: 97  (11/27 1524) Intake/Output from previous day: 11/26 0701 - 11/27 0700 In: 5496.3 [P.O.:420; I.V.:4081.3; Blood:325; IV Piggyback:650] Out: 2445 [Urine:1315; Drains:330; Blood:800] Intake/Output from this shift: Total I/O In: 2895 [P.O.:420; I.V.:975; IV Piggyback:1500] Out: 750 [Urine:640; Drains:110]  Labs:  [facility] 11/15/11 1650 11/15/11 0710 11/14/11 1515  WBC -- -- 16.4*  HGB -- -- 9.6*  PLT -- -- 231  LABCREA 172.39 -- --  CREATININE -- 1.23* 0.93   Estimated Creatinine Clearance: 47 ml/min (by C-G formula based on Cr of 1.23). No results found for this basename:  VANCOTROUGH:2,VANCOPEAK:2,VANCORANDOM:2,GENTTROUGH:2,GENTPEAK:2,GENTRANDOM:2,TOBRATROUGH:2,TOBRAPEAK:2,TOBRARND:2,AMIKACINPEAK:2,AMIKACINTROU:2,AMIKACIN:2, in the last 72 hours   Microbiology: Recent Results (from the past 720 hour(s))  SURGICAL PCR SCREEN     Status: Normal   Collection Time   11/07/11 12:35 PM      Component Value Range Status Comment   MRSA, PCR NEGATIVE  NEGATIVE  Final    Staphylococcus aureus NEGATIVE  NEGATIVE  Final     Medical History: Past Medical History  Diagnosis Date  . Oxygen decrease 2010    during colonoscopy and elbow surgery 4 yrs ago.   [name] Hypertension   . Asthma   . Shortness of breath   . GERD (gastroesophageal reflux disease)   . Hiatal hernia   . Arthritis   . On home oxygen therapy     at night    Medications:  Prescriptions prior to admission  Medication Sig Dispense Refill  . Calcium Carb-Cholecalciferol [phone] MG-UNIT CAPS Take 1 tablet by mouth 2 (two) times daily.        . Glucosamine HCl-Glucosamin SO4 (GLUCOSAMINE COMPLEX PO) Take 2,000 mg by mouth 2 (two) times daily.        . GuaiFENesin (QC MEDIFIN 400 PO) Take 2 tablets by mouth 2 (two) times daily.        [name] HYDROcodone-acetaminophen (VICODIN) 5-500 MG per tablet Take 1 tablet by mouth every 4 (four) hours as needed.        [name] omeprazole (PRILOSEC) 40 MG capsule Take 40 mg by mouth every morning.        . polyethylene glycol powder (GLYCOLAX/MIRALAX) powder Take 17 g by mouth daily as needed. For constipation       . ramipril (ALTACE) 10 MG capsule Take 10 mg by mouth every morning.        . sertraline (  ZOLOFT) 50 MG tablet Take 50 mg by mouth every morning.        . simvastatin (ZOCOR) 40 MG tablet Take 40 mg by mouth at bedtime.        . vitamin C (ASCORBIC ACID) 500 MG tablet Take 500 mg by mouth every morning.        [name] DISCONTD: aspirin EC 81 MG tablet Take 81 mg by mouth every morning.        [name] DISCONTD: Garlic 1000 MG CAPS Take 1 capsule by mouth daily.        [name]  acetaminophen (TYLENOL) 500 MG tablet Take 1,000 mg by mouth every 4 (four) hours as needed. For leg burning       . meloxicam (MOBIC) 7.5 MG tablet Take 7.5 mg by mouth every morning.        . Multiple Vitamins-Minerals (MULTIVITAMINS THER. W/MINERALS) TABS Take 1 tablet by mouth every morning.        [name] OMEGA 3-6-9 FATTY ACIDS PO Take 1 capsule by mouth 2 (two) times daily.         Lovenia Kim Pharm.D., BCPS 11/15/2011 6:44 PM [phone]

## 2011-11-15 NOTE — Progress Notes (Signed)
Subjective: Patient reports Severe low back pain but no leg pain she denies any new numbness attending her legs. she denies any tingling in her legs.  Objective: Vital signs in last 24 hours: Temp:  [97.2 F (36.2 C)-98.7 F (37.1 C)] 98.7 F (37.1 C) (11/27 0400) Pulse Rate:  [88-123] 103  (11/27 0700) Resp:  [9-22] 9  (11/27 0700) BP: (81-136)/(49-96) 89/64 mmHg (11/27 0700) SpO2:  [93 %-99 %] 96 % (11/27 0700) Weight:  [105 kg (231 lb 7.7 oz)-108.6 kg (239 lb 6.7 oz)] 239 lb 6.7 oz (108.6 kg) (11/27 0500)  Intake/Output from previous day: 11/26 0701 - 11/27 0700 In: 5496.3 [P.O.:420; I.V.:4081.3; Blood:325; IV Piggyback:650] Out: 2435 [Urine:1305; Drains:330; Blood:800] Intake/Output this shift: Total I/O In: 75 [I.V.:75] Out: -   Strength is 5 out of 5 in her iliopsoas quads hamstrings gastrocs and EHL. And her wound is clean and dry.  Lab Results:  [facility] 11/14/11 1515  WBC 16.4*  HGB 9.6*  HCT 29.6*  PLT 231   BMET  Basename 11/14/11 1515  NA 138  K 5.4*  CL 108  CO2 23  GLUCOSE 182*  BUN 17  CREATININE 0.93  CALCIUM 8.0*    Studies/Results: Dg Lumbar Spine 2-3 Views  11/14/2011  *RADIOLOGY REPORT*  Clinical Data: Low back pain.  LUMBAR SPINE - 2-3 VIEW  Comparison: MRI 09/29/2011.  Findings: C-arm films document L2-S1 PLIF.      Grossly satisfactory position and alignment.  IMPRESSION: As above.  Original Report Authenticated By: Elsie Stain, M.D.   Dg C-arm Gt 120 Min  11/14/2011  CLINICAL DATA: PLIF L two through S one   C-ARM GT 120 MIN  Fluoroscopy was utilized by the requesting physician.  No radiographic  interpretation.      Assessment/Plan: Progressively mobilized day with physical therapy advance her diet get her out of bed.  LOS: 1 day     Aundra Espin P 11/15/2011, 8:31 AM

## 2011-11-15 NOTE — Progress Notes (Signed)
Physical Therapy Evaluation Patient Details Name: Kristine [name] MRN: [id number] DOB: 12-24-1930 Today's Date: 11/15/2011  Problem List: There is no problem list on file for this patient.   Past Medical History:  Past Medical History  Diagnosis Date  . Oxygen decrease 2010    during colonoscopy and elbow surgery 4 yrs ago.   [name] Hypertension   . Asthma   . Shortness of breath   . GERD (gastroesophageal reflux disease)   . Hiatal hernia   . Arthritis   . On home oxygen therapy     at night   Past Surgical History:  Past Surgical History  Procedure Date  . Ovarian cyst removal 50's  . Fracture surgery   . Joint replacement right knee  . Carpal tunnel release 80's    PT Assessment/Plan/Recommendation PT Assessment Clinical Impression Statement: Pt with s/p PLIF and post sx hypotension limiting pt mobility.  Pt also limited due to back and abdominal pain.  Pt will benefit from acute PT services to improve overall mobility and prepare for safe d/c home with family. PT Recommendation/Assessment: Patient will need skilled PT in the acute care venue PT Problem List: Decreased activity tolerance;Decreased balance;Decreased mobility;Decreased knowledge of use of DME;Pain;Decreased range of motion Barriers to Discharge: Other (comment);None (Pt has support at home but depends on pt progress on d/c) PT Therapy Diagnosis : Difficulty walking;Abnormality of gait;Generalized weakness;Acute pain PT Plan PT Frequency: Min 5X/week PT Treatment/Interventions: DME instruction;Gait training;Stair training;Functional mobility training;Therapeutic exercise;Balance training;Patient/family education PT Recommendation Follow Up Recommendations: Home health PT (Pt unable to progress & family unable (A) may need IP rehab) Equipment Recommended: 3 in 1 bedside comode PT Goals  Acute Rehab PT Goals PT Goal Formulation: With patient Time For Goal Achievement: 7 days Pt will Roll Supine to Right  Side: with modified independence PT Goal: Rolling Supine to Right Side - Progress: Not met Pt will go Supine/Side to Sit: with modified independence PT Goal: Supine/Side to Sit - Progress: Not met Pt will go Sit to Supine/Side: with min assist PT Goal: Sit to Supine/Side - Progress: Not met Pt will Transfer Bed to Chair/Chair to Bed: with min assist PT Transfer Goal: Bed to Chair/Chair to Bed - Progress: Not met Pt will Ambulate: 16 - 50 feet;with supervision;with rolling walker PT Goal: Ambulate - Progress: Not met Pt will Go Up / Down Stairs: 1-2 stairs;with min assist;with least restrictive assistive device PT Goal: Up/Down Stairs - Progress: Not met Additional Goals Additional Goal #1: Pt will be able to recall and adhere to 3/3 back precautions. 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 Arousal/Alertness: Awake/alert Overall Cognitive Status: Appears within functional limits for tasks assessed Orientation Level: Oriented X4 Sensation/Coordination Sensation Light Touch: Appears Intact Extremity Assessment RUE Assessment RUE Assessment: Within Functional Limits LUE Assessment LUE Assessment: Within Functional Limits RLE Assessment RLE Assessment: Within Functional Limits LLE Assessment LLE Assessment: Exceptions to WFL LLE AROM (degrees) Left Knee Flexion 0-140: 10  (Pt with chronic knee extension from  previous sx.) Mobility (including Balance) Bed Mobility Bed Mobility: Yes  Rolling Right: 3: Mod assist;With rail Rolling Right Details (indicate cue type and reason): VCs for proper technique and (A) to complete and initiate roll. Right Sidelying to Sit: 6: Modified independent (Device/Increase time);HOB flat;With rails Sitting - Scoot to Edge of Bed: 4: Min assist;With rail (To perform proper weight shifts to advance hips to EOB) Sit to Supine - Right: 1: +2 Total assist;Patient percentage (comment);HOB flat;With rail ((Pt 50%)) Sit to Supine - Right Details (indicate cue type and reason): VCs for proper technique to sidelye to right.  (A) to slowly descend shoulders and elevate LE into bed. Transfers Transfers: Yes Lateral/Scoot Transfers: 3: Mod assist;From elevated surface;Other (comment) (Pt attempted to stand but unable to side step to EOB) Lateral/Scoot Transfer Details (indicate cue type and reason): Pt needed to side scoot to [facility] for proper position before returning to supine.  Pt unable to stand to side step to [facility]. Ambulation/Gait Ambulation/Gait: No Stairs: No Wheelchair Mobility Wheelchair Mobility: No  Posture/Postural Control Posture/Postural Control: No significant limitations Balance Balance Assessed: Yes Static Sitting Balance Static Sitting - Balance Support: Feet supported Static Sitting - Level of Assistance: 5: Stand by assistance Static Sitting - Comment/# of Minutes: ~5 min EOB Vitals:  Pt remained hypotensive throughout session.  Therefore limited eval to sitting EOB.  Pt moving well but limited due to pain.  Pt BP ranged from 82/49 to 93/60.      End of Session PT - End of Session Equipment Utilized During Treatment: Gait belt;Back brace;Other (comment) (3L O2) Activity Tolerance: Patient limited by pain;Other (comment) (Pt c/o back and abdominal pain.  Initial 10/10 & dec to 7/10) Patient left: in bed;with bed alarm set;with family/visitor  present Nurse Communication: Other (comment) (Discussed with RN on sitting pt EOB due to low BP.) General Behavior During Session: [facility] for tasks performed Cognition: [facility] for tasks performed    Correen Bubolz 11/15/2011, 3:53 PM [phone]

## 2011-11-15 NOTE — Consult Note (Signed)
HISTORY of PRESENT ILLNESS:  Kristine [name] is a 77 y.o. female with PMH of HTN, Asthma, GERD, on home nocturnal O2, with long-standing back and bilateral leg pain worse on the left for several years of admitted on 11/14/2011 for planned Lumbar fusion.  Post-op coarse complicated by hypotension and decreased UOP.  PCCM consulted for assistance with care.  ABX 11/27 Vanco>>>    Past Medical History  Diagnosis Date  . Oxygen decrease 2010    during colonoscopy and elbow surgery 4 yrs ago.   [name] Hypertension   . Asthma   . Shortness of breath   . GERD (gastroesophageal reflux disease)   . Hiatal hernia   . Arthritis   . On home oxygen therapy     at night    Past Surgical History  Procedure Date  . Ovarian cyst removal 50's  . Fracture surgery   . Joint replacement right knee  . Carpal tunnel release 80's   Scheduled Medications    . calcium-vitamin D  1 tablet Oral BID  . docusate sodium  100 mg Oral BID  . multivitamins ther. w/minerals  1 tablet Oral QAM  . pantoprazole  40 mg Oral Q1200  . ramipril  10 mg Oral QAM  . sertraline  50 mg Oral QAM  . simvastatin  40 mg Oral QHS  . sodium chloride  1,000 mL Intravenous Once  . sodium chloride  500 mL Intravenous Once  . sodium chloride  3 mL Intravenous Q12H  . vancomycin  750 mg Intravenous Q12H  . vitamin C  500 mg Oral QAM  . white petrolatum      . DISCONTD: Calcium Carb-Cholecalciferol  1 tablet Oral BID  . DISCONTD: pantoprazole (PROTONIX) IV  40 mg Intravenous QHS     History reviewed. No pertinent family history.   reports that she has quit smoking. Her smoking use included Cigarettes. She does not have any smokeless tobacco history on file. She reports that she does not drink alcohol or use illicit drugs.  Allergies  Allergen Reactions  . Advair Diskus (Fluticasone-Salmeterol) Other (See Comments)    HR and BP go up  . Relafen (Nabumetone) Nausea Only  . Amoxicillin (Amoxil) Rash       ROS: Constitutional:   No  weight loss, night sweats,  Fevers, chills, fatigue, lassitude. HEENT:   No headaches,  Difficulty swallowing,  Tooth/dental problems,  Sore throat,                No sneezing, itching, ear ache, nasal congestion, post nasal drip,   CV:  No chest pain,  Orthopnea, PND, swelling in lower extremities, anasarca, dizziness, palpitations  GI  Positive for heartburn, indigestion and no BM in last day.  Denies abdominal pain, nausea, vomiting, diarrhea, loss of appetite  Resp: No shortness of breath with exertion or at rest.  No excess mucus, no productive cough,  No non-productive cough,  No coughing up of blood.  No change in color of mucus.  No wheezing.  No chest wall deformity  Skin: no rash or lesions.  GU: no dysuria, change in color of urine, no urgency or frequency.  No flank pain.  MS:  No joint pain or swelling.  No decreased range of motion.  No back pain.  Psych:  No change in mood or affect. No depression or anxiety.  No memory loss.    Blood pressure 82/47, pulse 87, temperature 99.1 F (37.3 C), temperature source Oral, resp. rate 20,  height 5\' 6"  (1.676 m), weight 239 lb 6.7 oz (108.6 kg), SpO2 100.00%. PHYICAL EXAM: General: obese adult female in NAD, sitting on side of bed Neuro: AAOx4, speech clear, MAE CV: s1s2 rrr, no m/r/g PULM: resp's even/non-labored, lungs bilaterally diminished but clear GI: obese/soft, bsx4 active Extremities: warm/dry, L leg     LABS BMET    Component Value Date/Time   NA 136 11/15/2011 0710   K 5.6* 11/15/2011 0710   CL 105 11/15/2011 0710   CO2 25 11/15/2011 0710   GLUCOSE 122* 11/15/2011 0710   BUN 22 11/15/2011 0710   CREATININE 1.23* 11/15/2011 0710   CALCIUM 7.7* 11/15/2011 0710   GFRNONAA 41* 11/15/2011 0710   GFRAA 47* 11/15/2011 0710    CBC    Component Value Date/Time   WBC 16.4* 11/14/2011 1515   RBC 3.09* 11/14/2011 1515   HGB 9.6* 11/14/2011 1515   HCT 29.6* 11/14/2011 1515    PLT 231 11/14/2011 1515   MCV 95.8 11/14/2011 1515   MCH 31.1 11/14/2011 1515   MCHC 32.4 11/14/2011 1515   RDW 12.9 11/14/2011 1515   LYMPHSABS 1.4 11/14/2011 1515   MONOABS 0.4 11/14/2011 1515   EOSABS 0.1 11/14/2011 1515   BASOSABS 0.0 11/14/2011 1515    RADIOLOGIC DATA 11/19 CXR>>>Bibasilar atelectasis and/or scarring. No acute findings.   ASSESSMENT/PLAN: 1. Hypotension- mild with good mentation.  On ACE inhibitor prior to admit, received am of 11/27 and multiple doses of pain medications. Has had 1.5 L of NS on 11/27 am.   PLAN: - Hold ACE. - NS bolus x1 now, increase IVF to 125/hr. - Caution with narcotics.  2. Decreased UOP, Mild increase in Sr. CR (0.93 11/26 >>1.23 11/27) with associated hyperkalemia. PLAN: - See above. - Check FENA. - F/U BMP. - HOLD ACE.  3. S/p Lumbar fusion in setting of DJD PLAN: - Recommendations per Dr. Wynetta Emery - Vancomycin per NSGY  Canary Brim, NP-C [facility] Pgr: [phone]  Likely related to drugs and low intervascular volume.  Agree with IVF hydration and holding ACE.  Vanc to be adjusted for UOP by pharmacy to avoid nephrotoxicity.  Patient seen and examined, agree with above note.  I dictated the care and orders written for this patient under my direction.  Koren Bound, M.D.

## 2011-11-15 NOTE — Clinical Documentation Improvement (Signed)
Anemia Blood Loss Clarification  THIS DOCUMENT IS NOT A PERMANENT PART OF THE MEDICAL RECORD  Dear Lowella Petties Marton Redwood  In an effort to better capture your patient's severity of illness, reflect appropriate length of stay and utilization of resources, a review of the patient medical record has revealed the following indicators.   Based on your clinical judgment, please clarify and document in a progress note and/or discharge summary the clinical condition associated with the following supporting information: In responding to this query please exercise your independent judgment.  The fact that a query is asked, does not imply that any particular answer is desired or expected.  TO RESPOND TO THE THIS QUERY, FOLLOW THE INSTRUCTIONS BELOW:  1. If needed, update documentation for the patient's encounter via the notes activity.  2. Access this query again and click edit on the [employer].  3. After updating, or not, click F2 to complete all highlighted (required) fields concerning your review. Select "additional documentation in the medical record" OR "no additional documentation provided". 4. Click Sign note button.  5. The deficiency will fall out of your InBasket  *Please let us know if you are not able to compete this workflow by phone or e-mail (listed below).   Please consider the below (if your clinical findings/judgment agree) as you document the patient's condition(s) in the progress note and discharge summary. Thank you!   Possible Clinical Conditions?  - Expected Acute Blood Loss Anemia  - Acute Blood Loss Anemia  - Other condition (please document in the progress notes and/or discharge summary)  - Cannot Clinically determine at this time    Supporting Information:  - Recent surgery  - EBL=  - Pre-OP H&H= 12 / 36.9  - Post OP H&H= 9.6 / 29.6  - Intraoperative Treatments= Cell Saver   Query answered in Dr. Sung Amabile progress note  11/28.   Thank You,  Sincerely, Eldred Manges  Clinical Documentation Specialist Health Information Management [place] Email: [email or web address]

## 2011-11-16 DIAGNOSIS — J45909 Unspecified asthma, uncomplicated: Secondary | ICD-10-CM

## 2011-11-16 LAB — PREPARE RBC (CROSSMATCH)

## 2011-11-16 LAB — BASIC METABOLIC PANEL
Calcium: 7.5 mg/dL — ABNORMAL LOW (ref 8.4–10.5)
Creatinine, Ser: 1.06 mg/dL (ref 0.50–1.10)
GFR calc Af Amer: 57 mL/min — ABNORMAL LOW (ref >90)

## 2011-11-16 LAB — CBC
HCT: 20.6 % — ABNORMAL LOW (ref 36.0–46.0)
MCH: 30.4 pg (ref 26.0–34.0)
MCHC: 31.6 g/dL (ref 30.0–36.0)
RDW: 13 % (ref 11.5–15.5)

## 2011-11-16 LAB — VANCOMYCIN, TROUGH: Vancomycin Tr: 18.3 ug/mL (ref 10.0–20.0)

## 2011-11-16 MED ORDER — BUDESONIDE 0.5 MG/2ML IN SUSP
0.5000 mg | Freq: Two times a day (BID) | RESPIRATORY_TRACT | Status: DC
  Administered 2011-11-16 – 2011-11-17 (×3): 0.5 mg via RESPIRATORY_TRACT
  Filled 2011-11-16 (×8): qty 2

## 2011-11-16 MED ORDER — GUAIFENESIN ER 600 MG PO TB12
600.0000 mg | ORAL_TABLET | Freq: Two times a day (BID) | ORAL | Status: DC
  Administered 2011-11-16: 600 mg via ORAL
  Filled 2011-11-16 (×3): qty 1

## 2011-11-16 MED ORDER — VANCOMYCIN HCL 1000 MG IV SOLR
750.0000 mg | Freq: Two times a day (BID) | INTRAVENOUS | Status: DC
  Administered 2011-11-16 – 2011-11-18 (×4): 750 mg via INTRAVENOUS
  Filled 2011-11-16 (×5): qty 750

## 2011-11-16 MED ORDER — BENZONATATE 100 MG PO CAPS
100.0000 mg | ORAL_CAPSULE | Freq: Three times a day (TID) | ORAL | Status: DC | PRN
  Filled 2011-11-16: qty 1

## 2011-11-16 MED ORDER — ALBUTEROL SULFATE (5 MG/ML) 0.5% IN NEBU: 2.5000 mg | INHALATION_SOLUTION | RESPIRATORY_TRACT | Status: DC | PRN

## 2011-11-16 MED FILL — Heparin Sodium (Porcine) Inj 1000 Unit/ML: INTRAMUSCULAR | Qty: 30 | Status: AC

## 2011-11-16 MED FILL — Sodium Chloride Irrigation Soln 0.9%: Qty: 3000 | Status: AC

## 2011-11-16 MED FILL — Sodium Chloride IV Soln 0.9%: INTRAVENOUS | Qty: 1000 | Status: AC

## 2011-11-16 NOTE — Progress Notes (Signed)
Patient ID: Kristine [name], female   DOB: 10-08-1931, 77 y.o.   MRN: [id number]   SUBJ:  C/O back pain. No complaints of dyspnea or CP. No cough  OBJ: Filed Vitals:   11/16/11 0800  BP: 84/66  Pulse: 100  Temp:   Resp: 23   NAD HEENT WNL NECK supple without LVD CHEST: normal effort, normal percussion note, BS full without wheezes or other adventitious sounds ABD: distended and tympanitic EXT: warm , no edema NEURO: nonfocal  CBC    Component Value Date/Time   WBC 9.5 11/16/2011 0517   RBC 2.14* 11/16/2011 0517   HGB 6.5* 11/16/2011 0517   HCT 20.6* 11/16/2011 0517   PLT 157 11/16/2011 0517   MCV 96.3 11/16/2011 0517   MCH 30.4 11/16/2011 0517   MCHC 31.6 11/16/2011 0517   RDW 13.0 11/16/2011 0517   LYMPHSABS 1.4 11/14/2011 1515   MONOABS 0.4 11/14/2011 1515   EOSABS 0.1 11/14/2011 1515   BASOSABS 0.0 11/14/2011 1515   BMET    Component Value Date/Time   NA 137 11/16/2011 0350   K 4.4 11/16/2011 0350   CL 108 11/16/2011 0350   CO2 24 11/16/2011 0350   GLUCOSE 107* 11/16/2011 0350   BUN 19 11/16/2011 0350   CREATININE 1.06 11/16/2011 0350   CALCIUM 7.5* 11/16/2011 0350   GFRNONAA 49* 11/16/2011 0350   GFRAA 57* 11/16/2011 0350    No new CXR  IMP/PLAN: 1) Post op L spine Surgery - mgmt per Dr Wynetta Emery  2) Post op anemia - likley acute blood loss related to surgery. Agree with RBC transfusion as per Dr Wynetta Emery.   3) Hypotension - resolved. Appears euvolemic. Will D/C IVFs as she is taking POs now  4) Asthma - she uses only nebulized albuterol @ home and states that she uses it most days of the week. Presently no evidence of bronchospasm. Nonetheless, if the dx of asthma is accurate and if she is requiring albuterol more days than not, she should be started on a maintenance medication. I have ordered pulmicort nebs while she is hospitalized and PRN albuterol. After discharge, she should probably go home on an inhaled steroid such as Pulmicort inhaler or Flovent.  Note that she has had tachycardia with Advair and therefore this should not be used as her maintenance medication  Billy Fischer, MD;  PCCM service; Mobile [phone]

## 2011-11-16 NOTE — Progress Notes (Signed)
ANTIBIOTIC CONSULT NOTE - FOLLOW UP Pharmacy Consult for Vancomycin Indication: Empiric therapy post Spinal Surgery  Allergies  Allergen Reactions  . Advair Diskus (Fluticasone-Salmeterol) Other (See Comments)    HR and BP go up  . Relafen (Nabumetone) Nausea Only  . Amoxicillin (Amoxil) Rash   Patient Measurements: Height: 5\' 6"  (167.6 cm) Weight: 247 lb 9.2 oz (112.3 kg) IBW/kg (Calculated) : 59.3  Total Body weight: 103kg (11/07/11)  Vital Signs: Temp: 99.2 F (37.3 C) (11/28 1110) Temp src: Oral (11/28 1010) BP: 115/57 mmHg (11/28 1200) Pulse Rate: 93  (11/28 1200) Intake/Output from previous day: 11/27 0701 - 11/28 0700 In: 4327.5 [P.O.:540; I.V.:2287.5; IV Piggyback:1500] Out: 2855 [Urine:2340; Drains:515] Intake/Output from this shift: Total I/O In: 852.5 [P.O.:240; I.V.:250; Blood:162.5; Other:200] Out: 340 [Urine:340]  Labs:  [facility] 11/16/11 0517 11/16/11 0350 11/15/11 1650 11/15/11 0710 11/14/11 1515  WBC 9.5 -- -- -- 16.4*  HGB 6.5* -- -- -- 9.6*  PLT 157 -- -- -- 231  LABCREA -- -- 172.39 -- --  CREATININE -- 1.06 -- 1.23* 0.93   Estimated Creatinine Clearance: 55.6 ml/min (by C-G formula based on Cr of 1.06).  Basename 11/16/11 0350  VANCOTROUGH 18.3  VANCOPEAK --  VANCORANDOM --  GENTTROUGH --  GENTPEAK --  GENTRANDOM --  TOBRATROUGH --  TOBRAPEAK --  TOBRARND --  AMIKACINPEAK --  AMIKACINTROU --  AMIKACIN --     Microbiology: Recent Results (from the past 720 hour(s))  SURGICAL PCR SCREEN     Status: Normal   Collection Time   11/07/11 12:35 PM      Component Value Range Status Comment   MRSA, PCR NEGATIVE  NEGATIVE  Final    Staphylococcus aureus NEGATIVE  NEGATIVE  Final     Medical History: Past Medical History  Diagnosis Date  . Oxygen decrease 2010    during colonoscopy and elbow surgery 4 yrs ago.   [name] Hypertension   . Asthma   . Shortness of breath   . GERD (gastroesophageal reflux disease)   . Hiatal hernia   .  Arthritis   . On home oxygen therapy     at night    Medications:  Prescriptions prior to admission  Medication Sig Dispense Refill  . Calcium Carb-Cholecalciferol [phone] MG-UNIT CAPS Take 1 tablet by mouth 2 (two) times daily.        . Glucosamine HCl-Glucosamin SO4 (GLUCOSAMINE COMPLEX PO) Take 2,000 mg by mouth 2 (two) times daily.        . GuaiFENesin (QC MEDIFIN 400 PO) Take 2 tablets by mouth 2 (two) times daily.        [name] HYDROcodone-acetaminophen (VICODIN) 5-500 MG per tablet Take 1 tablet by mouth every 4 (four) hours as needed.        [name] omeprazole (PRILOSEC) 40 MG capsule Take 40 mg by mouth every morning.        . polyethylene glycol powder (GLYCOLAX/MIRALAX) powder Take 17 g by mouth daily as needed. For constipation       . ramipril (ALTACE) 10 MG capsule Take 10 mg by mouth every morning.        . sertraline (ZOLOFT) 50 MG tablet Take 50 mg by mouth every morning.        . simvastatin (ZOCOR) 40 MG tablet Take 40 mg by mouth at bedtime.        . vitamin C (ASCORBIC ACID) 500 MG tablet Take 500 mg by mouth every morning.        [name]  DISCONTD: aspirin EC 81 MG tablet Take 81 mg by mouth every morning.        [name] DISCONTD: Garlic 1000 MG CAPS Take 1 capsule by mouth daily.        [name] acetaminophen (TYLENOL) 500 MG tablet Take 1,000 mg by mouth every 4 (four) hours as needed. For leg burning       . meloxicam (MOBIC) 7.5 MG tablet Take 7.5 mg by mouth every morning.        . Multiple Vitamins-Minerals (MULTIVITAMINS THER. W/MINERALS) TABS Take 1 tablet by mouth every morning.        [name] OMEGA 3-6-9 FATTY ACIDS PO Take 1 capsule by mouth 2 (two) times daily.           Assessment: 77 yo obese female s/p L2- L5 decompression stabilization procedure with vancomycin ordered post-op until hemovac removed. Last charted vancomycin dose was 18:40 yesterday, and AM vancomycin level of 18.3 mcg/mL.   Goal of Therapy:  Vancomycin trough level 15-20 mcg/ml  Plan:  1. Will resume vancomycin  750mg  IV Q12H.   Lorre Munroe, PharmD, BCPS 11/16/11, 12:21 PM

## 2011-11-16 NOTE — Progress Notes (Signed)
Physical Therapy Treatment Patient Details Name: Myndi Wamble MRN: [id number] DOB: 08-06-1931 Today's Date: 11/16/2011  PT Assessment/Plan  PT - Assessment/Plan Comments on Treatment Session: Pt had received 2 units of blood prior to today's session and tolerated mobility much better today (no orthostasis) PT Plan: Discharge plan remains appropriate;Frequency remains appropriate PT Frequency: Min 5X/week Follow Up Recommendations: Home health PT PT Goals  Acute Rehab PT Goals Pt will Roll Supine to Left Side: with modified independence PT Goal: Rolling Supine to Left Side - Progress: Progressing toward goal PT Goal: Supine/Side to Sit - Progress: Progressing toward goal PT Transfer Goal: Bed to Chair/Chair to Bed - Progress: Progressing toward goal PT Goal: Ambulate - Progress: Progressing toward goal Additional Goals PT Goal: Additional Goal #1 - Progress: Progressing toward goal  PT Treatment Precautions/Restrictions  Precautions Precautions: Back Precaution Comments: Pt able to verbalize 2/3 back precautions at beginning and end of session (does not recall arching) Required Braces or Orthoses: Yes Spinal Brace: Lumbar corset;Applied in sitting position Restrictions Weight Bearing Restrictions: No Mobility (including Balance) Bed Mobility Rolling Left: 3: Mod assist;With rail Rolling Left Details (indicate cue type and reason): Pt moved to a new room with exit to Lt of bed more practical; requires cues and assist to maintain back precautions Left Sidelying to Sit: 3: Mod assist;HOB flat;With rails Left Sidelying to Sit Details (indicate cue type and reason): cues and assist to maintain back precautions; cues for sequencing Sitting - Scoot to Edge of Bed: 5: Supervision Sitting - Scoot to Edge of Bed Details (indicate cue type and reason): cues to prevent forward flexion of spine (pt tends to want to flex forward to scoot--instructed to put hands by/behind hips to scoot  herself forward) Transfers Sit to Stand: 4: Min assist;From bed;From elevated surface Sit to Stand Details (indicate cue type and reason): bed elevated to simulate home environment; cues for safe use of RW Stand to Sit: With upper extremity assist;With armrests;To chair/3-in-1;3: Mod assist Stand to Sit Details: vc for technique; required assist to control descent; pt reports she has a lift chair at home that she has been using (and hopes to not need anymore) Ambulation/Gait Ambulation/Gait: Yes Ambulation/Gait Assistance: 4: Min assist Ambulation/Gait Assistance Details (indicate cue type and reason): for safety Ambulation Distance (Feet): 45 Feet Assistive device: Rolling walker Gait Pattern: Step-to pattern;Antalgic (step-to pattern due to previous LLE injury)  Posture/Postural Control Posture/Postural Control: No significant limitations Exercise    End of Session PT - End of Session Equipment Utilized During Treatment: Gait belt;Back brace Activity Tolerance: Patient tolerated treatment well Patient left: in chair;with call bell in reach;with family/visitor present Nurse Communication: Mobility status for ambulation General Behavior During Session: [facility] for tasks performed Cognition: [facility] for tasks performed  Rockney Grenz 11/16/2011, 4:11 PM Pager [phone]

## 2011-11-16 NOTE — Progress Notes (Signed)
Subjective: Patient reports That she is feeling well she has no radicullarr ssymptoms and a pain medicine is controlling her back pain. she denies any new numbness or tingling in her legs. She had a little bit of a coughing fit this morning and feels a little dizzy sitting up.  Objective: Vital signs in last 24 hours: Temp:  [98.9 F (37.2 C)-100 F (37.8 C)] 99.5 F (37.5 C) (11/28 0308) Pulse Rate:  [87-111] 100  (11/28 0800) Resp:  [15-25] 23  (11/28 0800) BP: (76-113)/(43-66) 84/66 mmHg (11/28 0800) SpO2:  [95 %-100 %] 99 % (11/28 0800) Weight:  [112.3 kg (247 lb 9.2 oz)] 247 lb 9.2 oz (112.3 kg) (11/28 0400)  Intake/Output from previous day: 11/27 0701 - 11/28 0700 In: 4327.5 [P.O.:540; I.V.:2287.5; IV Piggyback:1500] Out: 2855 [Urine:2340; Drains:515] Intake/Output this shift: Total I/O In: 125 [I.V.:125] Out: -   She is awake alert sitting up in a chair comfortably. Strength is 5 out of 5 in her iliopsoas quads hamstrings gastrocs and EHL she may have some slight baseline dorsiflexion weakness of the left foot. her wound is clean and dry.  Lab Results:  [facility] 11/16/11 0517 11/14/11 1515  WBC 9.5 16.4*  HGB 6.5* 9.6*  HCT 20.6* 29.6*  PLT 157 231   BMET  Basename 11/16/11 0350 11/15/11 0710  NA 137 136  K 4.4 5.6*  CL 108 105  CO2 24 25  GLUCOSE 107* 122*  BUN 19 22  CREATININE 1.06 1.23*  CALCIUM 7.5* 7.7*    Studies/Results: Dg Lumbar Spine 2-3 Views  11/14/2011  *RADIOLOGY REPORT*  Clinical Data: Low back pain.  LUMBAR SPINE - 2-3 VIEW  Comparison: MRI 09/29/2011.  Findings: C-arm films document L2-S1 PLIF.      Grossly satisfactory position and alignment.  IMPRESSION: As above.  Original Report Authenticated By: Elsie Stain, M.D.   Dg C-arm Gt 120 Min  11/14/2011  CLINICAL DATA: PLIF L two through S one   C-ARM GT 120 MIN  Fluoroscopy was utilized by the requesting physician.  No radiographic  interpretation.      Assessment/Plan: Postop day 2  from L2-S1 fusion doing well. Slightly high output from her Hemovac drain 300 cc overnight 500 total to 24 hours. Her hemoglobin is down the 6 hematocrit 20. I recommended transfusions we'll transfuse 2 units packed red cells with Lasix in between pending critical-care's evaluation. The remainder of her laboratory data is within normal limits with improvement of her BUN and creatinine. Systolic blood pressures are in the mid 90s hours she is tachycardic to 105. I believe the transfusion will significantly help her blood pressure and her heart rate. And I have explained the risks and benefits of transfusing her and the potential consequences of not transfusing her to the patient and her husband. They have agreed to transfuse.  LOS: 2 days     Raekwan Spelman P 11/16/2011, 8:44 AM

## 2011-11-16 NOTE — Progress Notes (Signed)
CRITICAL VALUE ALERT  Critical value received:  hbg 6.5 Date of notification:  11/16/2011   Time of notification:  06:20  Critical value read back:yes  Nurse who received alert:  Sol Passer   MD notified (1st page):  06:18  Time of first page:  06:18  MD notified (2nd page):  Time of second page:  Responding MD:  Dr Darrick Penna  Time MD responded: 06:20

## 2011-11-16 NOTE — Progress Notes (Signed)
eLink Physician-Brief Progress Note Patient Name: Kristine [name] DOB: [birth year] MRN: [id number]  Date of Service  11/16/2011   HPI/Events of Note  Cough with mild exp wheezing  eICU Interventions  Patient with known h/o of asthma but allergy to advair diskus now with cough and mild exp wheezing.  ACE - I d/ced.  In no acute resp distress with good sats. Plan: Home gaifenesin restarted Tessalon for cough   Intervention Category Intermediate Interventions: Respiratory distress - evaluation and management  Tyrome Donatelli 11/16/2011, 12:25 AM

## 2011-11-17 DIAGNOSIS — R4182 Altered mental status, unspecified: Secondary | ICD-10-CM

## 2011-11-17 DIAGNOSIS — R34 Anuria and oliguria: Secondary | ICD-10-CM

## 2011-11-17 DIAGNOSIS — I959 Hypotension, unspecified: Secondary | ICD-10-CM

## 2011-11-17 DIAGNOSIS — D6489 Other specified anemias: Secondary | ICD-10-CM

## 2011-11-17 LAB — TYPE AND SCREEN
Antibody Screen: NEGATIVE
Unit division: 0

## 2011-11-17 LAB — DIFFERENTIAL
Basophils Relative: 0 % (ref 0–1)
Eosinophils Absolute: 0.3 10*3/uL (ref 0.0–0.7)
Eosinophils Relative: 2 % (ref 0–5)
Monocytes Relative: 8 % (ref 3–12)
Neutrophils Relative %: 73 % (ref 43–77)

## 2011-11-17 LAB — CBC
Hemoglobin: 8.6 g/dL — ABNORMAL LOW (ref 12.0–15.0)
MCH: 30.7 pg (ref 26.0–34.0)
MCHC: 33.6 g/dL (ref 30.0–36.0)
MCV: 91.4 fL (ref 78.0–100.0)
Platelets: 147 10*3/uL — ABNORMAL LOW (ref 150–400)

## 2011-11-17 NOTE — Progress Notes (Signed)
Physical Therapy Treatment Patient Details Name: Kristine [name] MRN: [id number] DOB: [birth year] Today's Date: 11/17/2011  PT Assessment/Plan  PT - Assessment/Plan Comments on Treatment Session: Patient with excellent progress  - limitations in mobility appear to be related to pre-morbid left knee issues as much as recent back surgery. PT Plan: Discharge plan remains appropriate Follow Up Recommendations: Home health PT PT Goals  Acute Rehab PT Goals PT Goal: Sit to Supine/Side - Progress: Met PT Goal: Ambulate - Progress: Progressing toward goal Additional Goals PT Goal: Additional Goal #1 - Progress: Partly met (Await consistency for goal achievement)  PT Treatment Precautions/Restrictions  Precautions Precautions: Back Precaution Comments: Reviewed back precautions and patient able to recall all 3 Required Braces or Orthoses: Yes Spinal Brace: Lumbar corset;Applied in sitting position Restrictions Weight Bearing Restrictions: No Mobility (including Balance) Bed Mobility Sit to Supine - Left: 4: Min assist;HOB flat Sit to Supine - Left Details (indicate cue type and reason): Patient with difficulty raising left lower extremity secondary to pre-morbid restrictions in ROM and strength. Patient able to verbalize correct sequencing Transfers Sit to Stand: 5: Supervision;From chair/3-in-1;With upper extremity assist Sit to Stand Details (indicate cue type and reason): Correct hand placement Stand to Sit: 5: Supervision;With upper extremity assist;To bed Stand to Sit Details: Noted uncontrolled descent ? secondary to limited left knee flexion leading to difficulty with control at end range Ambulation/Gait Ambulation/Gait Assistance: 4: Min assist Ambulation/Gait Assistance Details (indicate cue type and reason): Min-guard assistance - Patient with left lower extremity circumduction secondary to left knee deficits. Vebal cues for posture. Patient did require one brief rest break  (10 seconds) in standing secondary to fatigue. Assistive device: Rolling walker Gait Pattern: Left circumduction;Step-to pattern    End of Session PT - End of Session Equipment Utilized During Treatment: Back brace Activity Tolerance: Patient tolerated treatment well Patient left: in bed;with family/visitor present General Behavior During Session: [facility] for tasks performed Cognition: [facility] for tasks performed  Edwyna Perfect, PT  Pager [phone]  11/17/2011, 10:38 AM

## 2011-11-17 NOTE — Progress Notes (Signed)
Subjective: Patient reports That she's feeling fairly well this morning. She is having no leg pain. She denies any numbness and tingling. She is passing gas and urinating well.  Objective: Vital signs in last 24 hours: Temp:  [98.8 F (37.1 C)-99.8 F (37.7 C)] 99.2 F (37.3 C) (11/29 0405) Pulse Rate:  [75-114] 96  (11/29 0800) Resp:  [10-28] 26  (11/29 0800) BP: (90-115)/(40-78) 114/65 mmHg (11/29 0800) SpO2:  [91 %-100 %] 95 % (11/29 0800)  Intake/Output from previous day: 11/28 0701 - 11/29 0700 In: 2315 [P.O.:960; I.V.:380; Blood:625; IV Piggyback:150] Out: 3165 [Urine:2915; Drains:250] Intake/Output this shift: Total I/O In: 120 [P.O.:120] Out: 165 [Urine:165]  She is ambulating well her strength is 5 out of 5 in her wound is clean and dry.  Lab Results:  [facility] 11/16/11 0517 11/14/11 1515  WBC 9.5 16.4*  HGB 6.5* 9.6*  HCT 20.6* 29.6*  PLT 157 231   BMET  Basename 11/16/11 0350 11/15/11 0710  NA 137 136  K 4.4 5.6*  CL 108 105  CO2 24 25  GLUCOSE 107* 122*  BUN 19 22  CREATININE 1.06 1.23*  CALCIUM 7.5* 7.7*    Studies/Results: No results found.  Assessment/Plan: Progressive mobilization physical outpatient therapy DC her Foley transfer to the floor recheck a CBC .  LOS: 3 days     Kamarius Buckbee P 11/17/2011, 10:27 AM

## 2011-11-17 NOTE — Progress Notes (Signed)
Patient ID: Kristine [name], female   DOB: [birth year], 77 y.o.   MRN: [id number]  SUBJ:  No sob  OBJ: Filed Vitals:   11/17/11 1200  BP: 95/67  Pulse: 88  Temp:   Resp: 21   NAD HEENT WNL NECK supple without LVD CHEST: normal effort, no wheezing ABD: distended and tympanitic EXT: warm , no edema NEURO: nonfocal, strength equal  CBC    Component Value Date/Time   WBC 10.6* 11/17/2011 1215   RBC 2.80* 11/17/2011 1215   HGB 8.6* 11/17/2011 1215   HCT 25.6* 11/17/2011 1215   PLT 147* 11/17/2011 1215   MCV 91.4 11/17/2011 1215   MCH 30.7 11/17/2011 1215   MCHC 33.6 11/17/2011 1215   RDW 14.9 11/17/2011 1215   LYMPHSABS 1.8 11/17/2011 1215   MONOABS 0.8 11/17/2011 1215   EOSABS 0.3 11/17/2011 1215   BASOSABS 0.0 11/17/2011 1215   BMET    Component Value Date/Time   NA 137 11/16/2011 0350   K 4.4 11/16/2011 0350   CL 108 11/16/2011 0350   CO2 24 11/16/2011 0350   GLUCOSE 107* 11/16/2011 0350   BUN 19 11/16/2011 0350   CREATININE 1.06 11/16/2011 0350   CALCIUM 7.5* 11/16/2011 0350   GFRNONAA 49* 11/16/2011 0350   GFRAA 57* 11/16/2011 0350    No new CXR  IMP/PLAN: 1) Post op L spine Surgery - mgmt per Dr Wynetta Emery  2) Post op anemia - repeat cbc reviewed, appropriate rise, no active blood loss noted scd bilat  3) Hypotension - resolved, maintain SL to avoid overload and dilutional anemia  4) Asthma - reviewed Dr Sung Amabile note, agree to inhaled steroid in setting increased mdi use, no distress noted  Will sign off call if needed Consider transfer  Mcarthur Rossetti. Tyson Alias, MD, FACP Pgr: [phone] [facility]

## 2011-11-18 MED ORDER — HYDROCODONE-ACETAMINOPHEN 5-325 MG PO TABS: 2.0000 | ORAL_TABLET | ORAL | Status: AC | PRN

## 2011-11-18 NOTE — Progress Notes (Signed)
Physical Therapy Treatment Patient Details Name: Kristine [name] MRN: [id number] DOB: 1931/07/17 Today's Date: 11/18/2011  PT Assessment/Plan  PT - Assessment/Plan Comments on Treatment Session: Patient would benefit from a 3 in 1 and HH PT at discharge - MD please order. Patient progressing well - most mobilty issues are pre-morbid PT Plan: Discharge plan remains appropriate Follow Up Recommendations: Home health PT Equipment Recommended: 3 in 1 bedside comode PT Goals  Acute Rehab PT Goals PT Goal: Ambulate - Progress: Met Additional Goals PT Goal: Additional Goal #1 - Progress: Met  PT Treatment Precautions/Restrictions  Precautions Precautions: Back Precaution Comments: Continues to be able to recall all back precautions Required Braces or Orthoses: Yes Spinal Brace: Lumbar corset;Applied in sitting position Restrictions Weight Bearing Restrictions: No Mobility (including Balance) Bed Mobility Rolling Right Details (indicate cue type and reason): Reviewed log roll technique and side <> sit. Patient declines practise at this time as eager to bathe Transfers Sit to Stand: From chair/3-in-1;4: Min assist;With upper extremity assist Sit to Stand Details (indicate cue type and reason): Patient with difficulty initiating today without assistance. Patient has lift chair at home and states often needs assistance to stand from lower surfaces secondary to left knee deficits Stand to Sit: 5: Supervision;With upper extremity assist;To chair/3-in-1 Ambulation/Gait Ambulation/Gait Assistance: 5: Supervision Ambulation Distance (Feet): 170 Feet Assistive device: Rolling walker Gait Pattern: Left circumduction;Step-to pattern Stairs:  (Declines - states spouse always assisted PTA)    End of Session PT - End of Session Equipment Utilized During Treatment: Back brace Activity Tolerance: Patient tolerated treatment well (Limited by pre-morbid deficits) Patient left: in chair;with call  bell in reach General Behavior During Session: [facility] for tasks performed Cognition: [facility] for tasks performed  Edwyna Perfect, PT  Pager [phone]  11/18/2011, 8:31 AM

## 2011-11-18 NOTE — Progress Notes (Signed)
Utilization review completed. Saul Dorsi, RN, BSN. 11/18/11  

## 2011-11-18 NOTE — Discharge Summary (Signed)
Physician Discharge Summary  Patient ID: Kristine [name] MRN: [id number] DOB/AGE: 08/13/1931 77 y.o.  Admit date: 11/14/2011 Discharge date: 11/18/2011  Admission Diagnoses: Grade 1 spondylolisthesis L4-5 and lumbar spinal stenosis L4-5 L5-S1  Discharge Diagnoses: Same Active Problems:  * No active hospital problems. *    Discharged Condition: good  Hospital Course: Patient was admitted and underwent the aforementioned procedure of a decompressive lumbar laminectomy and posterior lumbar interbody fusion at L4-5 and L5-S1. Postoperatively patient did very well went to the recovery room and in the ICU was observed in the ICU over the first 3 days postop. Initially her blood counts drifted down to wear her hemoglobin was 6 and hematocrit was 20 and had the transfuse 2 units of packed cells. This significantly improved the way she felt she stopped getting dizzy her heart rate came down and she progressed rapidly with her recovery at this point. By postop day 4 patient was angling and voiding spontaneously complaining of no leg pain back pain was well-controlled on oral analgesics. Patient stable to be discharged home scheduled followup in one week.  Consults:CCM Significant Diagnostic Studies: Treatments: Discharge Exam: Blood pressure 108/60, pulse 74, temperature 98.7 F (37.1 C), temperature source Oral, resp. rate 18, height 5\' 6"  (1.676 m), weight 112.3 kg (247 lb 9.2 oz), SpO2 97.00%.  Patient is neurologically intact with 5 out of 5 strength in her iliopsoas, quadriceps, hamstrings, gastrocs, anterior tibialis, and EHL. Her wound is clean and dry. Disposition: Home   Current Discharge Medication List    CONTINUE these medications which have NOT CHANGED   Details  Calcium Carb-Cholecalciferol [phone] MG-UNIT CAPS Take 1 tablet by mouth 2 (two) times daily.      Glucosamine HCl-Glucosamin SO4 (GLUCOSAMINE COMPLEX PO) Take 2,000 mg by mouth 2 (two) times daily.        GuaiFENesin (QC MEDIFIN 400 PO) Take 2 tablets by mouth 2 (two) times daily.      HYDROcodone-acetaminophen (VICODIN) 5-500 MG per tablet Take 1 tablet by mouth every 4 (four) hours as needed.      omeprazole (PRILOSEC) 40 MG capsule Take 40 mg by mouth every morning.      polyethylene glycol powder (GLYCOLAX/MIRALAX) powder Take 17 g by mouth daily as needed. For constipation     ramipril (ALTACE) 10 MG capsule Take 10 mg by mouth every morning.      sertraline (ZOLOFT) 50 MG tablet Take 50 mg by mouth every morning.      simvastatin (ZOCOR) 40 MG tablet Take 40 mg by mouth at bedtime.      vitamin C (ASCORBIC ACID) 500 MG tablet Take 500 mg by mouth every morning.      acetaminophen (TYLENOL) 500 MG tablet Take 1,000 mg by mouth every 4 (four) hours as needed. For leg burning     meloxicam (MOBIC) 7.5 MG tablet Take 7.5 mg by mouth every morning.      Multiple Vitamins-Minerals (MULTIVITAMINS THER. W/MINERALS) TABS Take 1 tablet by mouth every morning.      OMEGA 3-6-9 FATTY ACIDS PO Take 1 capsule by mouth 2 (two) times daily.        STOP taking these medications     aspirin EC 81 MG tablet      Garlic 1000 MG CAPS        Follow-up Information    Follow up with Makaylin Carlo P in 1 week.   Contact information:   [address] [place] [address] [phone]  Signed: Hadlee Burback P 11/18/2011, 7:37 AM

## 2011-11-18 NOTE — Progress Notes (Signed)
D/C Nursing Note  Patient discharged home with spouse via wheelchair.  Case manager came to set up PT.  D/C instructions and rx's were given.  VS stable.  3-in-1 delivered.  No other needs at this time.  Kristine [name] [place] 11/18/2011

## 2011-11-18 NOTE — Progress Notes (Signed)
Subjective: Patient reports Patient is doing very well as morning with very minimal leg pain back pain is well controlled on oral analgesics  Objective: Vital signs in last 24 hours: Temp:  [98.2 F (36.8 C)-99.4 F (37.4 C)] 98.7 F (37.1 C) (11/30 0400) Pulse Rate:  [72-100] 74  (11/30 0600) Resp:  [15-27] 18  (11/30 0600) BP: (95-114)/(46-89) 108/60 mmHg (11/30 0600) SpO2:  [94 %-97 %] 97 % (11/30 0600)  Intake/Output from previous day: 11/29 0701 - 11/30 0700 In: 1230 [P.O.:1080; IV Piggyback:150] Out: 1965 [Urine:1865; Drains:100] Intake/Output this shift:    Strength is 5 of 5 in her iliopsoas quads hamstrings gastrocs EHL. Wound is clean and dry.  Lab Results:  [facility] 11/17/11 1215 11/16/11 0517  WBC 10.6* 9.5  HGB 8.6* 6.5*  HCT 25.6* 20.6*  PLT 147* 157   BMET  Basename 11/16/11 0350  NA 137  K 4.4  CL 108  CO2 24  GLUCOSE 107*  BUN 19  CREATININE 1.06  CALCIUM 7.5*    Studies/Results: No results found.  Assessment/Plan: Patient is postop day 4 from out L2-S1 fusion doing very well ambulating and voiding spontaneously tolerating a regular diet patient will be discharged home.  LOS: 4 days     Tymesha Ditmore P 11/18/2011, 7:59 AM

## 2011-12-27 ENCOUNTER — Other Ambulatory Visit: Payer: Self-pay | Admitting: Neurosurgery

## 2011-12-27 ENCOUNTER — Ambulatory Visit
Admission: RE | Admit: 2011-12-27 | Discharge: 2011-12-27 | Disposition: A | Discharge status: Home / Self Care | Payer: Medicare Other | Source: Ambulatory Visit | Attending: Neurosurgery | Admitting: Neurosurgery

## 2011-12-27 DIAGNOSIS — M545 Low back pain: Secondary | ICD-10-CM

## 2013-05-22 ENCOUNTER — Encounter: Payer: Self-pay | Admitting: *Deleted

## 2013-05-31 NOTE — Telephone Encounter (Signed)
This encounter was created in error - please disregard.

## 2015-05-12 ENCOUNTER — Other Ambulatory Visit: Payer: Self-pay | Admitting: Neurosurgery

## 2015-06-13 NOTE — Pre-Procedure Instructions (Signed)
Kristine [name]  06/13/2015     Your procedure is scheduled on July 6.  Report to [facility] Admitting at 6:30 A.M.  Call this number if you have problems the morning of surgery:  [phone]   Remember:  Do not eat food or drink liquids after midnight.  Take these medicines the morning of surgery with A SIP OF WATER Tylenol (if needed), Cymbalta, Gabapentin, Omeprazole, Zoloft, Tramadol (if needed)   STOP Omega 3/ Fish Oil, Meloxicam, Glucosamine, and Calcium today   STOP/ Do not take Aspirin, Aleve, Naproxen, Advil, Ibuprofen, Motrin, Vitamins, Herbs, or Supplements starting today   Do not wear jewelry, make-up or nail polish.  Do not wear lotions, powders, or perfumes.  You may wear deodorant.  Do not shave 48 hours prior to surgery.  Men may shave face and neck.  Do not bring valuables to the hospital.  [facility] is not responsible for any belongings or valuables.  Contacts, dentures or bridgework may not be worn into surgery.  Leave your suitcase in the car.  After surgery it may be brought to your room.  For patients admitted to the hospital, discharge time will be determined by your treatment team.  [facility] - Preparing for Surgery  Before surgery, you can play an important role.  Because skin is not sterile, your skin needs to be as free of germs as possible.  You can reduce the number of germs on you skin by washing with CHG (chlorahexidine gluconate) soap before surgery.  CHG is an antiseptic cleaner which kills germs and bonds with the skin to continue killing germs even after washing.  Please DO NOT use if you have an allergy to CHG or antibacterial soaps.  If your skin becomes reddened/irritated stop using the CHG and inform your nurse when you arrive at Short Stay.  Do not shave (including legs and underarms) for at least 48 hours prior to the first CHG shower.  You may shave your face.  Please follow these instructions carefully:   1.  Shower  with CHG Soap the night before surgery and the morning of Surgery.  2.  If you choose to wash your hair, wash your hair first as usual with your normal shampoo.  3.  After you shampoo, rinse your hair and body thoroughly to remove the shampoo.  4.  Use CHG as you would any other liquid soap.  You can apply CHG directly to the skin and wash gently with scrungie or a clean washcloth.  5.  Apply the CHG Soap to your body ONLY FROM THE NECK DOWN.  Do not use on open wounds or open sores.  Avoid contact with your eyes, ears, mouth and genitals (private parts).  Wash genitals (private parts) with your normal soap.  6.  Wash thoroughly, paying special attention to the area where your surgery will be performed.  7.  Thoroughly rinse your body with warm water from the neck down.  8.  DO NOT shower/wash with your normal soap after using and rinsing off the CHG Soap.  9.  Pat yourself dry with a clean towel.            10.  Wear clean pajamas.            11.  Place clean sheets on your bed the night of your first shower and do not sleep with pets.  Day of Surgery  Do not apply any lotions the morning of surgery.  Please wear clean clothes to the hospital/surgery center.    Please read over the following fact sheets that you were given. Pain Booklet, Coughing and Deep Breathing, Blood Transfusion Information and Surgical Site Infection Prevention

## 2015-06-15 ENCOUNTER — Encounter (HOSPITAL_COMMUNITY)
Admission: RE | Admit: 2015-06-15 | Discharge: 2015-06-15 | Disposition: A | Discharge status: Home / Self Care | Payer: Medicare HMO | Source: Ambulatory Visit | Attending: Neurosurgery | Admitting: Neurosurgery

## 2015-06-15 ENCOUNTER — Encounter (HOSPITAL_COMMUNITY): Payer: Self-pay

## 2015-06-15 DIAGNOSIS — Z01818 Encounter for other preprocedural examination: Secondary | ICD-10-CM | POA: Insufficient documentation

## 2015-06-15 DIAGNOSIS — R9431 Abnormal electrocardiogram [ECG] [EKG]: Secondary | ICD-10-CM | POA: Diagnosis not present

## 2015-06-15 DIAGNOSIS — K219 Gastro-esophageal reflux disease without esophagitis: Secondary | ICD-10-CM | POA: Diagnosis not present

## 2015-06-15 DIAGNOSIS — Z0183 Encounter for blood typing: Secondary | ICD-10-CM | POA: Diagnosis not present

## 2015-06-15 DIAGNOSIS — I10 Essential (primary) hypertension: Secondary | ICD-10-CM | POA: Insufficient documentation

## 2015-06-15 DIAGNOSIS — Z01812 Encounter for preprocedural laboratory examination: Secondary | ICD-10-CM | POA: Insufficient documentation

## 2015-06-15 DIAGNOSIS — Z79899 Other long term (current) drug therapy: Secondary | ICD-10-CM | POA: Diagnosis not present

## 2015-06-15 DIAGNOSIS — Z87891 Personal history of nicotine dependence: Secondary | ICD-10-CM | POA: Diagnosis not present

## 2015-06-15 DIAGNOSIS — J45909 Unspecified asthma, uncomplicated: Secondary | ICD-10-CM | POA: Diagnosis not present

## 2015-06-15 DIAGNOSIS — Z981 Arthrodesis status: Secondary | ICD-10-CM | POA: Diagnosis not present

## 2015-06-15 HISTORY — DX: Bronchitis, not specified as acute or chronic: J40

## 2015-06-15 HISTORY — DX: Cellulitis, unspecified: L03.90

## 2015-06-15 HISTORY — DX: Pneumonia, unspecified organism: J18.9

## 2015-06-15 LAB — BASIC METABOLIC PANEL
ANION GAP: 9 (ref 5–15)
BUN: 20 mg/dL (ref 6–20)
CALCIUM: 9.5 mg/dL (ref 8.9–10.3)
CO2: 27 mmol/L (ref 22–32)
Chloride: 103 mmol/L (ref 101–111)
Creatinine, Ser: 1.36 mg/dL — ABNORMAL HIGH (ref 0.44–1.00)
GFR calc Af Amer: 41 mL/min — ABNORMAL LOW (ref >60)
GFR, EST NON AFRICAN AMERICAN: 35 mL/min — AB (ref >60)
Glucose, Bld: 105 mg/dL — ABNORMAL HIGH (ref 65–99)
Potassium: 4.5 mmol/L (ref 3.5–5.1)
SODIUM: 139 mmol/L (ref 135–145)

## 2015-06-15 LAB — CBC
HEMATOCRIT: 35.7 % — AB (ref 36.0–46.0)
Hemoglobin: 11.6 g/dL — ABNORMAL LOW (ref 12.0–15.0)
MCH: 31 pg (ref 26.0–34.0)
MCHC: 32.5 g/dL (ref 30.0–36.0)
MCV: 95.5 fL (ref 78.0–100.0)
PLATELETS: 218 10*3/uL (ref 150–400)
RBC: 3.74 MIL/uL — AB (ref 3.87–5.11)
RDW: 12.4 % (ref 11.5–15.5)
WBC: 6.2 10*3/uL (ref 4.0–10.5)

## 2015-06-15 LAB — SURGICAL PCR SCREEN
MRSA, PCR: NEGATIVE
Staphylococcus aureus: NEGATIVE

## 2015-06-15 LAB — TYPE AND SCREEN
ABO/RH(D): A POS
Antibody Screen: NEGATIVE

## 2015-06-15 NOTE — Pre-Procedure Instructions (Signed)
[name]  06/15/2015     Your procedure is scheduled on Wednesday June 24, 2015 at 8:30 AM.  [facility] 6:30 [facility].M.  Call this number if you have problems the morning of surgery: [phone]    Remember:  Do not eat food or drink liquids after midnight.  Take these medicines the morning of surgery with A SIP OF WATER: Acetaminophen (Tylenol) if needed, Duloxetine (Cymbalta), Gabapentin (Neurontin), Omeprazole (Prilosec), Sertraline (Zoloft), Tramadol (Ultram) if needed for pain   STOP Omega 3/ Fish Oil, Meloxicam, Glucosamine, Calcium,  Aspirin, Aleve, Naproxen, Advil, Ibuprofen, Motrin, Vitamins, Herbs, or Supplements on Wednesday June 29th   Do not wear jewelry, make-up or nail polish.  Do not wear lotions, powders, or perfumes.    Do not shave 48 hours prior to surgery.    Do not bring valuables to the hospital.  [facility] is not responsible for any belongings or valuables.  Contacts, dentures or bridgework may not be worn into surgery.  Leave your suitcase in the car.  After surgery it may be brought to your room.  For patients admitted to the hospital, discharge time will be determined by your treatment team.  Shower using CHG soap the night before and the morning of your surgery  Please read over the following fact sheets that you were given. Pain Booklet, Coughing and Deep Breathing, Blood Transfusion Information and Surgical Site Infection Prevention

## 2015-06-15 NOTE — Progress Notes (Signed)
PCP is Brooke Bonito in [place]. Patient informed Nurse that she had a stress test last year. Will request results. Patient also informed Nurse that she had a sleep study a few years ago, but denied having sleep apnea. Patient does however wear oxygen two liters at bedtime. Patient denied having any acute cardiac or pulmonary issues. Husband at chair side during PAT appointment.

## 2015-06-16 NOTE — Progress Notes (Signed)
Anesthesia Chart Review:  Pt is 81 year old female scheduled for T9-L2 posterior lateral fusion on 06/24/2015 with Dr. Wynetta Emeryram.   PMH includes: HTN, asthma, GERD, uses 2L O2 at night. Former smoker. BMI 39. S/p 3 level posterior lumbar fusion 11/14/11.   Medications include: ramipril, zocor.   Preoperative labs reviewed.    EKG 06/15/2015: NSR. Low voltage QRS.   Echo 08/12/2013: -Mild concentric LVH -LV was not well visualized; grossly normal LV size and function -mild tricuspid regurgitation  Nuclear stress test 12/28/2010: -abnormal stress test with inferior and lateral ST depression with exercise.  -there is normal isotope uptake following exercise and at rest. There is no evidence of exercise induced ischemia on perfusion images -LV EF of 75%  If no changes, I anticipate pt can proceed with surgery as scheduled.   Rica Mastngela Ameerah Huffstetler, FNP-BC [facility]Anesthesiology Phone: [phone] 06/16/2015 2:17 PM

## 2015-06-23 MED ORDER — VANCOMYCIN HCL 10 G IV SOLR
1500.0000 mg | INTRAVENOUS | Status: AC
  Administered 2015-06-24: 1500 mg via INTRAVENOUS
  Filled 2015-06-23 (×2): qty 1500

## 2015-06-24 ENCOUNTER — Inpatient Hospital Stay (HOSPITAL_COMMUNITY): Payer: Medicare HMO | Admitting: Certified Registered Nurse Anesthetist

## 2015-06-24 ENCOUNTER — Encounter (HOSPITAL_COMMUNITY): Payer: Self-pay | Admitting: *Deleted

## 2015-06-24 ENCOUNTER — Inpatient Hospital Stay (HOSPITAL_COMMUNITY)
Admission: RE | Admit: 2015-06-24 | Discharge: 2015-06-28 | DRG: 460 | Disposition: A | Discharge status: Home Health | Payer: Medicare HMO | Source: Ambulatory Visit | Attending: Neurosurgery | Admitting: Neurosurgery

## 2015-06-24 ENCOUNTER — Inpatient Hospital Stay (HOSPITAL_COMMUNITY): Payer: Medicare HMO

## 2015-06-24 ENCOUNTER — Inpatient Hospital Stay (HOSPITAL_COMMUNITY): Payer: Medicare HMO | Admitting: Emergency Medicine

## 2015-06-24 ENCOUNTER — Encounter (HOSPITAL_COMMUNITY)
Admission: RE | Disposition: A | Discharge status: Home Health | Payer: Commercial Managed Care - HMO | Source: Ambulatory Visit | Attending: Neurosurgery

## 2015-06-24 DIAGNOSIS — M79606 Pain in leg, unspecified: Secondary | ICD-10-CM | POA: Diagnosis present

## 2015-06-24 DIAGNOSIS — M4316 Spondylolisthesis, lumbar region: Secondary | ICD-10-CM | POA: Diagnosis present

## 2015-06-24 DIAGNOSIS — Z881 Allergy status to other antibiotic agents status: Secondary | ICD-10-CM

## 2015-06-24 DIAGNOSIS — K219 Gastro-esophageal reflux disease without esophagitis: Secondary | ICD-10-CM | POA: Diagnosis present

## 2015-06-24 DIAGNOSIS — Z9981 Dependence on supplemental oxygen: Secondary | ICD-10-CM

## 2015-06-24 DIAGNOSIS — Z96651 Presence of right artificial knee joint: Secondary | ICD-10-CM | POA: Diagnosis present

## 2015-06-24 DIAGNOSIS — Z888 Allergy status to other drugs, medicaments and biological substances status: Secondary | ICD-10-CM | POA: Diagnosis not present

## 2015-06-24 DIAGNOSIS — M5136 Other intervertebral disc degeneration, lumbar region: Secondary | ICD-10-CM | POA: Diagnosis present

## 2015-06-24 DIAGNOSIS — M532X6 Spinal instabilities, lumbar region: Principal | ICD-10-CM | POA: Diagnosis present

## 2015-06-24 DIAGNOSIS — Z96641 Presence of right artificial hip joint: Secondary | ICD-10-CM | POA: Diagnosis present

## 2015-06-24 DIAGNOSIS — J45909 Unspecified asthma, uncomplicated: Secondary | ICD-10-CM | POA: Diagnosis present

## 2015-06-24 DIAGNOSIS — M545 Low back pain: Secondary | ICD-10-CM | POA: Diagnosis present

## 2015-06-24 DIAGNOSIS — M1990 Unspecified osteoarthritis, unspecified site: Secondary | ICD-10-CM | POA: Diagnosis present

## 2015-06-24 DIAGNOSIS — M51369 Other intervertebral disc degeneration, lumbar region without mention of lumbar back pain or lower extremity pain: Secondary | ICD-10-CM | POA: Diagnosis present

## 2015-06-24 DIAGNOSIS — M4120 Other idiopathic scoliosis, site unspecified: Secondary | ICD-10-CM | POA: Diagnosis present

## 2015-06-24 DIAGNOSIS — M4326 Fusion of spine, lumbar region: Secondary | ICD-10-CM

## 2015-06-24 DIAGNOSIS — I10 Essential (primary) hypertension: Secondary | ICD-10-CM | POA: Diagnosis present

## 2015-06-24 DIAGNOSIS — Z87891 Personal history of nicotine dependence: Secondary | ICD-10-CM | POA: Diagnosis not present

## 2015-06-24 HISTORY — PX: POSTERIOR LUMBAR FUSION 4 LEVEL: SHX6037

## 2015-06-24 SURGERY — POSTERIOR LUMBAR FUSION 4 LEVEL
Anesthesia: General | Site: Back

## 2015-06-24 MED ORDER — SODIUM CHLORIDE 0.9 % IJ SOLN
3.0000 mL | Freq: Two times a day (BID) | INTRAMUSCULAR | Status: DC
  Administered 2015-06-26 – 2015-06-28 (×4): 3 mL via INTRAVENOUS

## 2015-06-24 MED ORDER — HYDROMORPHONE HCL 1 MG/ML IJ SOLN
INTRAMUSCULAR | Status: AC
  Filled 2015-06-24: qty 1

## 2015-06-24 MED ORDER — SERTRALINE HCL 50 MG PO TABS
50.0000 mg | ORAL_TABLET | Freq: Every day | ORAL | Status: DC
  Administered 2015-06-24 – 2015-06-28 (×4): 50 mg via ORAL
  Filled 2015-06-24 (×5): qty 1

## 2015-06-24 MED ORDER — VECURONIUM BROMIDE 10 MG IV SOLR
INTRAVENOUS | Status: DC | PRN
  Administered 2015-06-24: 2 mg via INTRAVENOUS
  Administered 2015-06-24: 1 mg via INTRAVENOUS
  Administered 2015-06-24: 2 mg via INTRAVENOUS

## 2015-06-24 MED ORDER — HYDROMORPHONE HCL 1 MG/ML IJ SOLN
0.5000 mg | INTRAMUSCULAR | Status: DC | PRN
  Administered 2015-06-24: 1 mg via INTRAVENOUS
  Administered 2015-06-24 (×2): 0.5 mg via INTRAVENOUS
  Filled 2015-06-24 (×4): qty 1

## 2015-06-24 MED ORDER — GLUCOSAMINE COMPLEX PO TABS: ORAL_TABLET | Freq: Two times a day (BID) | ORAL | Status: DC

## 2015-06-24 MED ORDER — FENTANYL CITRATE (PF) 250 MCG/5ML IJ SOLN
INTRAMUSCULAR | Status: AC
  Filled 2015-06-24: qty 5

## 2015-06-24 MED ORDER — VANCOMYCIN HCL 10 G IV SOLR
1250.0000 mg | INTRAVENOUS | Status: DC
  Administered 2015-06-25 – 2015-06-28 (×4): 1250 mg via INTRAVENOUS
  Filled 2015-06-24 (×7): qty 1250

## 2015-06-24 MED ORDER — MEPERIDINE HCL 25 MG/ML IJ SOLN: 6.2500 mg | INTRAMUSCULAR | Status: DC | PRN

## 2015-06-24 MED ORDER — GABAPENTIN 300 MG PO CAPS
600.0000 mg | ORAL_CAPSULE | Freq: Two times a day (BID) | ORAL | Status: DC
  Administered 2015-06-24 – 2015-06-28 (×8): 600 mg via ORAL
  Filled 2015-06-24 (×8): qty 2

## 2015-06-24 MED ORDER — ROCURONIUM BROMIDE 50 MG/5ML IV SOLN
INTRAVENOUS | Status: AC
  Filled 2015-06-24: qty 1

## 2015-06-24 MED ORDER — RAMIPRIL 5 MG PO CAPS
10.0000 mg | ORAL_CAPSULE | Freq: Every day | ORAL | Status: DC
  Administered 2015-06-25: 10 mg via ORAL
  Filled 2015-06-24: qty 2

## 2015-06-24 MED ORDER — MELOXICAM 7.5 MG PO TABS
7.5000 mg | ORAL_TABLET | Freq: Every day | ORAL | Status: DC
  Administered 2015-06-24 – 2015-06-25 (×2): 7.5 mg via ORAL
  Filled 2015-06-24 (×2): qty 1

## 2015-06-24 MED ORDER — ACETAMINOPHEN 500 MG PO TABS: 1000.0000 mg | ORAL_TABLET | ORAL | Status: DC | PRN

## 2015-06-24 MED ORDER — SODIUM CHLORIDE 0.9 % IJ SOLN: 3.0000 mL | INTRAMUSCULAR | Status: DC | PRN

## 2015-06-24 MED ORDER — FENTANYL CITRATE (PF) 100 MCG/2ML IJ SOLN
INTRAMUSCULAR | Status: DC | PRN
  Administered 2015-06-24: 200 ug via INTRAVENOUS
  Administered 2015-06-24 (×5): 50 ug via INTRAVENOUS

## 2015-06-24 MED ORDER — PHENYLEPHRINE HCL 10 MG/ML IJ SOLN
INTRAMUSCULAR | Status: DC | PRN
  Administered 2015-06-24: 150 ug via INTRAVENOUS
  Administered 2015-06-24 (×3): 80 ug via INTRAVENOUS

## 2015-06-24 MED ORDER — SODIUM CHLORIDE 0.9 % IV SOLN
250.0000 mL | INTRAVENOUS | Status: DC
  Administered 2015-06-24: 250 mL via INTRAVENOUS

## 2015-06-24 MED ORDER — LACTATED RINGERS IV SOLN
INTRAVENOUS | Status: DC | PRN
  Administered 2015-06-24 (×2): via INTRAVENOUS

## 2015-06-24 MED ORDER — LIDOCAINE HCL (CARDIAC) 20 MG/ML IV SOLN
INTRAVENOUS | Status: AC
  Filled 2015-06-24: qty 5

## 2015-06-24 MED ORDER — MIDAZOLAM HCL 2 MG/2ML IJ SOLN: 0.5000 mg | Freq: Once | INTRAMUSCULAR | Status: DC | PRN

## 2015-06-24 MED ORDER — MONTELUKAST SODIUM 10 MG PO TABS
10.0000 mg | ORAL_TABLET | Freq: Every day | ORAL | Status: DC
  Administered 2015-06-24 – 2015-06-27 (×4): 10 mg via ORAL
  Filled 2015-06-24 (×4): qty 1

## 2015-06-24 MED ORDER — LIDOCAINE HCL (CARDIAC) 20 MG/ML IV SOLN
INTRAVENOUS | Status: DC | PRN
  Administered 2015-06-24: 40 mg via INTRAVENOUS

## 2015-06-24 MED ORDER — ARTIFICIAL TEARS OP OINT
TOPICAL_OINTMENT | OPHTHALMIC | Status: DC | PRN
Start: 2015-06-24 — End: 2015-06-24
  Administered 2015-06-24: 1 via OPHTHALMIC

## 2015-06-24 MED ORDER — DULOXETINE HCL 60 MG PO CPEP
60.0000 mg | ORAL_CAPSULE | Freq: Every day | ORAL | Status: DC
  Administered 2015-06-24 – 2015-06-28 (×5): 60 mg via ORAL
  Filled 2015-06-24 (×5): qty 1

## 2015-06-24 MED ORDER — VANCOMYCIN HCL 1000 MG IV SOLR
INTRAVENOUS | Status: DC | PRN
  Administered 2015-06-24: 1000 mg

## 2015-06-24 MED ORDER — VANCOMYCIN HCL 1000 MG IV SOLR
INTRAVENOUS | Status: AC
  Filled 2015-06-24: qty 1000

## 2015-06-24 MED ORDER — ACETAMINOPHEN 650 MG RE SUPP: 650.0000 mg | RECTAL | Status: DC | PRN

## 2015-06-24 MED ORDER — SIMVASTATIN 40 MG PO TABS
40.0000 mg | ORAL_TABLET | Freq: Every day | ORAL | Status: DC
  Administered 2015-06-24 – 2015-06-27 (×4): 40 mg via ORAL
  Filled 2015-06-24 (×4): qty 1

## 2015-06-24 MED ORDER — HYDROMORPHONE HCL 1 MG/ML IJ SOLN
0.2500 mg | INTRAMUSCULAR | Status: DC | PRN
  Administered 2015-06-24: 0.5 mg via INTRAVENOUS

## 2015-06-24 MED ORDER — BUPIVACAINE LIPOSOME 1.3 % IJ SUSP
INTRAMUSCULAR | Status: DC | PRN
  Administered 2015-06-24: 20 mL

## 2015-06-24 MED ORDER — POTASSIUM CHLORIDE IN NACL 20-0.45 MEQ/L-% IV SOLN
INTRAVENOUS | Status: DC
  Filled 2015-06-24 (×9): qty 1000

## 2015-06-24 MED ORDER — BUPIVACAINE LIPOSOME 1.3 % IJ SUSP
20.0000 mL | INTRAMUSCULAR | Status: DC
  Filled 2015-06-24: qty 20

## 2015-06-24 MED ORDER — THROMBIN 20000 UNITS EX SOLR
CUTANEOUS | Status: DC | PRN
  Administered 2015-06-24 (×2): 20 mL via TOPICAL

## 2015-06-24 MED ORDER — LIDOCAINE-EPINEPHRINE 1 %-1:100000 IJ SOLN
INTRAMUSCULAR | Status: DC | PRN
  Administered 2015-06-24: 20 mL

## 2015-06-24 MED ORDER — DEXAMETHASONE SODIUM PHOSPHATE 10 MG/ML IJ SOLN
INTRAMUSCULAR | Status: AC
  Filled 2015-06-24: qty 1

## 2015-06-24 MED ORDER — DEXTROSE 5 % IV SOLN
10.0000 mg | INTRAVENOUS | Status: DC | PRN
  Administered 2015-06-24: 15 ug/min via INTRAVENOUS

## 2015-06-24 MED ORDER — CALCIUM CARBONATE-VITAMIN D 500-200 MG-UNIT PO TABS
1.0000 | ORAL_TABLET | Freq: Two times a day (BID) | ORAL | Status: DC
  Administered 2015-06-24 – 2015-06-28 (×8): 1 via ORAL
  Filled 2015-06-24 (×8): qty 1

## 2015-06-24 MED ORDER — TRAMADOL HCL 50 MG PO TABS
50.0000 mg | ORAL_TABLET | Freq: Four times a day (QID) | ORAL | Status: DC | PRN
  Administered 2015-06-25 – 2015-06-28 (×4): 50 mg via ORAL
  Filled 2015-06-24 (×4): qty 1

## 2015-06-24 MED ORDER — PROMETHAZINE HCL 25 MG/ML IJ SOLN
6.2500 mg | INTRAMUSCULAR | Status: DC | PRN
Start: 2015-06-24 — End: 2015-06-24

## 2015-06-24 MED ORDER — 0.9 % SODIUM CHLORIDE (POUR BTL) OPTIME
TOPICAL | Status: DC | PRN
  Administered 2015-06-24: 1000 mL

## 2015-06-24 MED ORDER — POLYETHYLENE GLYCOL 3350 17 GM/SCOOP PO POWD
17.0000 g | Freq: Every day | ORAL | Status: DC | PRN
  Filled 2015-06-24: qty 255

## 2015-06-24 MED ORDER — MENTHOL 3 MG MT LOZG: 1.0000 | LOZENGE | OROMUCOSAL | Status: DC | PRN

## 2015-06-24 MED ORDER — ESMOLOL HCL 10 MG/ML IV SOLN
INTRAVENOUS | Status: DC | PRN
  Administered 2015-06-24: 20 mg via INTRAVENOUS
  Administered 2015-06-24: 30 mg via INTRAVENOUS

## 2015-06-24 MED ORDER — ONDANSETRON HCL 4 MG/2ML IJ SOLN
INTRAMUSCULAR | Status: DC | PRN
  Administered 2015-06-24: 4 mg via INTRAVENOUS

## 2015-06-24 MED ORDER — CYCLOBENZAPRINE HCL 10 MG PO TABS
10.0000 mg | ORAL_TABLET | Freq: Three times a day (TID) | ORAL | Status: DC | PRN
  Administered 2015-06-25: 10 mg via ORAL
  Filled 2015-06-24: qty 1

## 2015-06-24 MED ORDER — ROCURONIUM BROMIDE 100 MG/10ML IV SOLN
INTRAVENOUS | Status: DC | PRN
  Administered 2015-06-24: 50 mg via INTRAVENOUS

## 2015-06-24 MED ORDER — PROPOFOL 10 MG/ML IV BOLUS
INTRAVENOUS | Status: DC | PRN
  Administered 2015-06-24: 100 mg via INTRAVENOUS

## 2015-06-24 MED ORDER — ONDANSETRON HCL 4 MG/2ML IJ SOLN
INTRAMUSCULAR | Status: AC
  Filled 2015-06-24: qty 2

## 2015-06-24 MED ORDER — ACETAMINOPHEN 325 MG PO TABS: 650.0000 mg | ORAL_TABLET | ORAL | Status: DC | PRN

## 2015-06-24 MED ORDER — SODIUM CHLORIDE 0.9 % IR SOLN
Status: DC | PRN
  Administered 2015-06-24 (×2): 500 mL

## 2015-06-24 MED ORDER — ONDANSETRON HCL 4 MG/2ML IJ SOLN: 4.0000 mg | INTRAMUSCULAR | Status: DC | PRN

## 2015-06-24 MED ORDER — PHENOL 1.4 % MT LIQD: 1.0000 | OROMUCOSAL | Status: DC | PRN

## 2015-06-24 MED ORDER — CALCIUM CARB-CHOLECALCIFEROL 600-1000 MG-UNIT PO CAPS: 1.0000 | ORAL_CAPSULE | Freq: Two times a day (BID) | ORAL | Status: DC

## 2015-06-24 MED ORDER — POLYETHYLENE GLYCOL 3350 17 G PO PACK
17.0000 g | PACK | Freq: Every day | ORAL | Status: DC | PRN
  Administered 2015-06-26: 17 g via ORAL
  Filled 2015-06-24: qty 1

## 2015-06-24 MED ORDER — NEOSTIGMINE METHYLSULFATE 10 MG/10ML IV SOLN
INTRAVENOUS | Status: DC | PRN
  Administered 2015-06-24: 5 mg via INTRAVENOUS

## 2015-06-24 MED ORDER — PANTOPRAZOLE SODIUM 40 MG PO TBEC
40.0000 mg | DELAYED_RELEASE_TABLET | Freq: Every day | ORAL | Status: DC
  Administered 2015-06-25 – 2015-06-28 (×4): 40 mg via ORAL
  Filled 2015-06-24 (×4): qty 1

## 2015-06-24 MED ORDER — PROPOFOL 10 MG/ML IV BOLUS
INTRAVENOUS | Status: AC
  Filled 2015-06-24: qty 20

## 2015-06-24 MED ORDER — HYDROCODONE-ACETAMINOPHEN 5-325 MG PO TABS
1.0000 | ORAL_TABLET | ORAL | Status: DC | PRN
  Administered 2015-06-24 – 2015-06-25 (×2): 2 via ORAL
  Filled 2015-06-24 (×2): qty 2

## 2015-06-24 MED ORDER — GLYCOPYRROLATE 0.2 MG/ML IJ SOLN
INTRAMUSCULAR | Status: DC | PRN
  Administered 2015-06-24: .8 mg via INTRAVENOUS

## 2015-06-24 SURGICAL SUPPLY — 72 items
ALLOSTEM STRIP 20MMX50MM (Tissue) ×6 IMPLANT
BAG DECANTER FOR FLEXI CONT (MISCELLANEOUS) ×3 IMPLANT
BENZOIN TINCTURE PRP APPL 2/3 (GAUZE/BANDAGES/DRESSINGS) ×6 IMPLANT
BLADE CLIPPER SURG (BLADE) IMPLANT
BLADE SURG 11 STRL SS (BLADE) ×3 IMPLANT
BONE ALLOSTEM MORSELIZED 5CC (Bone Implant) ×12 IMPLANT
BRUSH SCRUB EZ PLAIN DRY (MISCELLANEOUS) ×3 IMPLANT
BUR MATCHSTICK NEURO 3.0 LAGG (BURR) ×3 IMPLANT
BUR PRECISION FLUTE 6.0 (BURR) ×3 IMPLANT
CANISTER SUCT 3000ML PPV (MISCELLANEOUS) ×3 IMPLANT
CAP REVERE LOCKING (Cap) ×33 IMPLANT
CLAMP PARALLEL CONNECTOR WIDE (Clamp) ×3 IMPLANT
CLAMP R REVERE OFFSET CONN (Clamp) ×3 IMPLANT
CLOSURE WOUND 1/2 X4 (GAUZE/BANDAGES/DRESSINGS) ×2
CONT SPEC 4OZ CLIKSEAL STRL BL (MISCELLANEOUS) ×6 IMPLANT
COVER BACK TABLE 60X90IN (DRAPES) ×3 IMPLANT
DECANTER SPIKE VIAL GLASS SM (MISCELLANEOUS) ×3 IMPLANT
DRAPE C-ARM 42X72 X-RAY (DRAPES) ×3 IMPLANT
DRAPE C-ARMOR (DRAPES) ×3 IMPLANT
DRAPE LAPAROTOMY 100X72X124 (DRAPES) ×3 IMPLANT
DRAPE POUCH INSTRU U-SHP 10X18 (DRAPES) ×3 IMPLANT
DRAPE PROXIMA HALF (DRAPES) IMPLANT
DRAPE SURG 17X23 STRL (DRAPES) ×3 IMPLANT
DRSG OPSITE 4X5.5 SM (GAUZE/BANDAGES/DRESSINGS) ×6 IMPLANT
DRSG OPSITE POSTOP 4X6 (GAUZE/BANDAGES/DRESSINGS) ×3 IMPLANT
DRSG OPSITE POSTOP 4X8 (GAUZE/BANDAGES/DRESSINGS) ×3 IMPLANT
DURAPREP 26ML APPLICATOR (WOUND CARE) ×3 IMPLANT
ELECT REM PT RETURN 9FT ADLT (ELECTROSURGICAL) ×3
ELECTRODE REM PT RTRN 9FT ADLT (ELECTROSURGICAL) ×1 IMPLANT
EVACUATOR 1/8 PVC DRAIN (DRAIN) ×3 IMPLANT
EVACUATOR 3/16  PVC DRAIN (DRAIN) ×2
EVACUATOR 3/16 PVC DRAIN (DRAIN) ×1 IMPLANT
GAUZE SPONGE 4X4 12PLY STRL (GAUZE/BANDAGES/DRESSINGS) ×3 IMPLANT
GAUZE SPONGE 4X4 16PLY XRAY LF (GAUZE/BANDAGES/DRESSINGS) ×3 IMPLANT
GLOVE BIO SURGEON STRL SZ8 (GLOVE) ×6 IMPLANT
GLOVE EXAM NITRILE LRG STRL (GLOVE) IMPLANT
GLOVE EXAM NITRILE MD LF STRL (GLOVE) IMPLANT
GLOVE EXAM NITRILE XL STR (GLOVE) IMPLANT
GLOVE EXAM NITRILE XS STR PU (GLOVE) IMPLANT
GLOVE INDICATOR 8.5 STRL (GLOVE) ×6 IMPLANT
GOWN STRL REUS W/ TWL LRG LVL3 (GOWN DISPOSABLE) IMPLANT
GOWN STRL REUS W/ TWL XL LVL3 (GOWN DISPOSABLE) ×2 IMPLANT
GOWN STRL REUS W/TWL 2XL LVL3 (GOWN DISPOSABLE) IMPLANT
GOWN STRL REUS W/TWL LRG LVL3 (GOWN DISPOSABLE)
GOWN STRL REUS W/TWL XL LVL3 (GOWN DISPOSABLE) ×4
KIT BASIN OR (CUSTOM PROCEDURE TRAY) ×3 IMPLANT
KIT INFUSE SMALL (Orthopedic Implant) ×3 IMPLANT
KIT ROOM TURNOVER OR (KITS) ×3 IMPLANT
LIQUID BAND (GAUZE/BANDAGES/DRESSINGS) ×6 IMPLANT
NEEDLE HYPO 25X1 1.5 SAFETY (NEEDLE) ×3 IMPLANT
NS IRRIG 1000ML POUR BTL (IV SOLUTION) ×3 IMPLANT
PACK LAMINECTOMY NEURO (CUSTOM PROCEDURE TRAY) ×3 IMPLANT
PAD ARMBOARD 7.5X6 YLW CONV (MISCELLANEOUS) ×9 IMPLANT
ROD SPINE HEX 6.35X200 (Rod) ×3 IMPLANT
SCREW REVERE 5.5X30MM (Screw) ×3 IMPLANT
SCREW REVERE 6.35 5.5X40MM (Screw) ×6 IMPLANT
SCREW REVERE 6.35 6.5MMX45 (Screw) ×6 IMPLANT
SCREW REVERE 6.35 6.5X40MM (Screw) ×12 IMPLANT
SPONGE LAP 4X18 X RAY DECT (DISPOSABLE) IMPLANT
SPONGE SURGIFOAM ABS GEL 100 (HEMOSTASIS) ×3 IMPLANT
STRIP CLOSURE SKIN 1/2X4 (GAUZE/BANDAGES/DRESSINGS) ×4 IMPLANT
SUT VIC AB 0 CT1 18XCR BRD8 (SUTURE) ×3 IMPLANT
SUT VIC AB 0 CT1 8-18 (SUTURE) ×6
SUT VIC AB 2-0 CT1 18 (SUTURE) ×9 IMPLANT
SUT VICRYL 4-0 PS2 18IN ABS (SUTURE) ×6 IMPLANT
SYR 20ML ECCENTRIC (SYRINGE) ×3 IMPLANT
TAPE STRIPS DRAPE STRL (GAUZE/BANDAGES/DRESSINGS) ×6 IMPLANT
TOWEL OR 17X24 6PK STRL BLUE (TOWEL DISPOSABLE) ×3 IMPLANT
TOWEL OR 17X26 10 PK STRL BLUE (TOWEL DISPOSABLE) ×3 IMPLANT
TRAY FOLEY W/METER SILVER 14FR (SET/KITS/TRAYS/PACK) ×3 IMPLANT
TUBING BULK SUCTION (MISCELLANEOUS) ×3 IMPLANT
WATER STERILE IRR 1000ML POUR (IV SOLUTION) ×3 IMPLANT

## 2015-06-24 NOTE — H&P (Signed)
Kristine [name] is an 81 y.o. female.   Chief Complaint: Back pain HPI: Patient is an 81 year old female who previously undergone an L2-L5 fusion came back to the office with worsening back pain that also had some mechanical pain that would radiate in the tops and anterior parts of both hips. Workup revealed progressive breakdown degeneration and a slight listhesis at L1-2. Due to her progression of clinical syndrome failure conservative treatment imaging findings showing progression of degeneration and breakdown around the L1-L2 disc space, I recommended an extension of her fusion up to T9. All of her radicular symptoms are mechanical in nature when she gets up and moves around she has no radicular symptoms and recumbency and arrest. Therefore, I feel that extension of her fusion up to T9 using iliac crest bone graft and using the edition set tie into her previous fusion gives her the best chance of success in treating her predominant symptom which is back pain. If we remove the motion we should remove the radicular symptoms as well. I've extensively gone over the risks and benefits of the operation with the patient as well as perioperative course expectations of outcome and alternatives of surgery and she understood and agreed to proceed forward.  Past Medical History  Diagnosis Date  . Oxygen decrease 2010    during colonoscopy and elbow surgery 4 yrs ago.   Kristine [name]. Hypertension   . Asthma   . Shortness of breath   . GERD (gastroesophageal reflux disease)   . Hiatal hernia   . Arthritis   . On home oxygen therapy 2 liters    at night  . Bronchitis   . Pneumonia     hx of  . Cellulitis     left leg    Past Surgical History  Procedure Laterality Date  . Ovarian cyst removal  50's  . Carpal tunnel release Bilateral 80's  . Joint replacement  right knee  . Fracture surgery Right     elbow  . Hip arthroplasty Right   . Rotator cuff repair Right   . Back surgery    . Colonoscopy    . Vein  surgery Left 1970s    leg     History reviewed. No pertinent family history. Social History:  reports that she has quit smoking. Her smoking use included Cigarettes. She has never used smokeless tobacco. She reports that she does not drink alcohol or use illicit drugs.  Allergies:  Allergies  Allergen Reactions  . Advair Diskus [Fluticasone-Salmeterol] Other (See Comments)    HR and BP go up  . Relafen [Nabumetone] Nausea Only  . Amoxicillin [Amoxicillin] Rash    Medications Prior to Admission  Medication Sig Dispense Refill  . Calcium Carb-Cholecalciferol [phone] MG-UNIT CAPS Take 1 tablet by mouth 2 (two) times daily.      . DULoxetine (CYMBALTA) 60 MG capsule Take 60 mg by mouth daily.    Kristine [name]. gabapentin (NEURONTIN) 300 MG capsule Take 600 mg by mouth 2 (two) times daily.    . Glucosamine HCl-Glucosamin SO4 (GLUCOSAMINE COMPLEX PO) Take 2,000 mg by mouth 2 (two) times daily.      . meloxicam (MOBIC) 7.5 MG tablet Take 7.5 mg by mouth daily.    . montelukast (SINGULAIR) 10 MG tablet Take 10 mg by mouth at bedtime.    . OMEGA 3-6-9 FATTY ACIDS PO Take 1 capsule by mouth 2 (two) times daily.      Kristine [name]. omeprazole (PRILOSEC) 40 MG capsule Take 40 mg by mouth  every morning.      . ramipril (ALTACE) 10 MG capsule Take 10 mg by mouth every morning.      . sertraline (ZOLOFT) 50 MG tablet Take 50 mg by mouth every morning.      . simvastatin (ZOCOR) 40 MG tablet Take 40 mg by mouth at bedtime.      . traMADol (ULTRAM) 50 MG tablet Take by mouth every 6 (six) hours as needed.    Kristine [name] acetaminophen (TYLENOL) 500 MG tablet Take 1,000 mg by mouth every 4 (four) hours as needed. For leg burning     . polyethylene glycol powder (GLYCOLAX/MIRALAX) powder Take 17 g by mouth daily as needed. For constipation       No results found for this or any previous visit (from the past 48 hour(s)). No results found.  Review of Systems  Constitutional: Negative.   HENT: Negative.   Eyes: Negative.    Respiratory: Negative.   Cardiovascular: Negative.   Gastrointestinal: Negative.   Genitourinary: Negative.   Musculoskeletal: Positive for myalgias, back pain and joint pain.  Skin: Negative.   Neurological: Positive for tingling.  Psychiatric/Behavioral: Negative.     Blood pressure 144/75, pulse 63, temperature 97.7 F (36.5 C), temperature source Oral, resp. rate 18, weight 106.142 kg (234 lb), SpO2 100 %. Physical Exam  Constitutional: She is oriented to person, place, and time. She appears well-developed.  HENT:  Head: Normocephalic.  Eyes: Pupils are equal, round, and reactive to light.  Neck: Normal range of motion.  Respiratory: Effort normal.  GI: Soft.  Neurological: She is alert and oriented to person, place, and time. She has normal strength. GCS eye subscore is 4. GCS verbal subscore is 5. GCS motor subscore is 6.  Strength is 5 out of 5 in her iliopsoas, quads, and she's, gastrocs, anterior tibialis, and EHL.     Assessment/Plan 81 year old female presents for T9-L2 extension of fusion.  Lalani Winkles P 06/24/2015, 8:01 AM

## 2015-06-24 NOTE — Anesthesia Preprocedure Evaluation (Addendum)
Anesthesia Evaluation  Patient identified by MRN, date of birth, ID band Patient awake    Reviewed: Allergy & Precautions, NPO status , Patient's Chart, lab work & pertinent test results  History of Anesthesia Complications Negative for: history of anesthetic complications  Airway Mallampati: I  TM Distance: >3 FB Neck ROM: Full    Dental  (+) Edentulous Upper, Poor Dentition, Missing, Dental Advisory Given   Pulmonary shortness of breath, asthma , sleep apnea and Oxygen sleep apnea , COPD (nocturnal oxygen) COPD inhaler and oxygen dependent, former smoker,  breath sounds clear to auscultation        Cardiovascular hypertension, Pt. on medications - anginaRhythm:Regular Rate:Normal  '14 ECHO:  Normal LVF, valves OK '12 stress test: EF 75%, no ischemia   Neuro/Psych Chronic back pain    GI/Hepatic Neg liver ROS, GERD-  Medicated and Controlled,  Endo/Other  Morbid obesity  Renal/GU Renal InsufficiencyRenal disease (creat 1.36)     Musculoskeletal  (+) Arthritis -, Osteoarthritis,    Abdominal (+) + obese,   Peds  Hematology  (+) Blood dyscrasia (Hb 11.6), ,   Anesthesia Other Findings   Reproductive/Obstetrics                           Anesthesia Physical Anesthesia Plan  ASA: III  Anesthesia Plan: General   Post-op Pain Management:    Induction: Intravenous  Airway Management Planned: Oral ETT  Additional Equipment:   Intra-op Plan:   Post-operative Plan: Extubation in OR  Informed Consent: I have reviewed the patients History and Physical, chart, labs and discussed the procedure including the risks, benefits and alternatives for the proposed anesthesia with the patient or authorized representative who has indicated his/her understanding and acceptance.   Dental advisory given  Plan Discussed with: CRNA, Surgeon and Anesthesiologist  Anesthesia Plan Comments: (Plan routine  monitors, GETA)       Anesthesia Quick Evaluation

## 2015-06-24 NOTE — Anesthesia Procedure Notes (Signed)
Procedure Name: Intubation Date/Time: 06/24/2015 8:31 AM Performed by: Dairl PonderJIANG, Aalayah Riles Pre-anesthesia Checklist: Patient identified, Timeout performed, Emergency Drugs available, Suction available and Patient being monitored Patient Re-evaluated:Patient Re-evaluated prior to inductionOxygen Delivery Method: Circle system utilized Preoxygenation: Pre-oxygenation with 100% oxygen Intubation Type: IV induction Ventilation: Mask ventilation without difficulty Laryngoscope Size: Mac and 3 Grade View: Grade I Tube type: Oral Tube size: 7.0 mm Number of attempts: 1 Airway Equipment and Method: Stylet Placement Confirmation: ETT inserted through vocal cords under direct vision,  breath sounds checked- equal and bilateral and positive ETCO2 Secured at: 22 cm Tube secured with: Tape Dental Injury: Teeth and Oropharynx as per pre-operative assessment

## 2015-06-24 NOTE — Anesthesia Postprocedure Evaluation (Signed)
  Anesthesia Post-op Note  Patient: Kristine [name]  Procedure(s) Performed: Procedure(s) with comments: Posterior Lateral Fusion - T9 -L2 with Iliac Crest Bone Graft (N/A) - Posterior Lateral Fusion - T9 -L2 with Iliac Crest Bone Graft  Patient Location: PACU  Anesthesia Type:General  Level of Consciousness: awake, alert , oriented and patient cooperative  Airway and Oxygen Therapy: Patient Spontanous Breathing and Patient connected to nasal cannula oxygen  Post-op Pain: mild  Post-op Assessment: Post-op Vital signs reviewed, Patient's Cardiovascular Status Stable, Respiratory Function Stable, Patent Airway, No signs of Nausea or vomiting and Pain level controlled              Post-op Vital Signs: Reviewed and stable  Last Vitals:  Filed Vitals:   06/24/15 1430  BP:   Pulse: 83  Temp:   Resp: 13    Complications: No apparent anesthesia complications

## 2015-06-24 NOTE — Consult Note (Signed)
PHARMACY CONSULT NOTE  Consult  :  Post Procedure Dosing of  Vancomycin Indication :  Empiric Post-Procedure Antibiotic Prophylaxis following spinal fusion and bone graft.   Pharmacy consulted for post-procedure dosing of Vancomycin s/p spinal surgery.  Patient has a large Hemovac drain in place.  Vancomycin will continue until drain removed.  Patient received Vancomycin 1500 mg IV x 1 pre-op this AM.    Dosing Wt 106 kg,  CrCl 39 ml/min based on Scr of 1.36.  GOAL:   Vancomycin trough level 15-20 mcg/ml  PLAN:  1. Due to renal insufficiency, first Vancomycin dose will be delayed until tomorrow morning. 2. Will begin Vancomycin 1250 mg IV q 24 hours until drain discontinued. 3. Monitor renal function, WBC, fever curve, any cultures/sensitivities, Vanc levels if therapy prolonged and as clinically indicated, length of therapy, and follow clinical progression.  Thank you for allowing Pharmacy to participate in this patient's care.   Haneefah Venturini, Colin BentonEarle,  Pharm.D.,  06/24/2015,  3:59 PM

## 2015-06-24 NOTE — Progress Notes (Signed)
Orthopedic Tech Progress Note Patient Details:  Kristine [name] 08-Jun-1931 [id number] Called order in to [facility]. Patient ID: Kristine MinceDorothy [name], female   DOB: 08-Jun-1931, 81 y.o.   MRN: [id number]   Lesle ChrisGilliland, Tahirih Lair L 06/24/2015, 4:31 PM

## 2015-06-24 NOTE — Op Note (Signed)
Preoperative diagnosis: Lumbar instability and degenerative disc disease L1-L2 with degenerative on top of idiopathic scoliosis throughout her thoracic and lumbar spine  Postoperative diagnosis: Same  Procedure: #1 posterior thoracic to lumbar pedicle screw fixation with globus 6.35 Revere pedicle screw system from T9-L2 utilizing the globus addition set to tie into a pre-existing construct from L2-S1  #2 posterior lateral arthrodesis T9-L2 using iliac crest bone graft, BMP, and allostem morsels and strips  #3  through a separate skin incision harvesting of left posterior superior iliac crest bone graft  Surgeon: Jillyn HiddenGary Shemika Robbs  Asst.: Marikay Alaravid Jones  Anesthesia: Gen.  EBL: Minimal  History of present illness: Patient is a very pleasant 81 year old female is a previous L2-S1 who did very well for several years however last several weeks and months is a progress worsening back pain. Workup revealed progressive degenerative disc disease and stability spondylolisthesis   at L1-L2 as well as scoliosis above this. Due to patient's progression of clinical syndrome imaging findings and failure conservative treatment I recommended a posterior lateral fusion with pedicle screws from T9 L2 with iliac crest bone graft. I extensively the risks and benefits of the operation with the patient as well as perioperative course expectations of outcome and alternatives of surgery and she understands and agrees to proceed forward.  Operative procedure: Patient brought in the or was induced under general anesthesia positioned prone on rolls her back was prepped and draped in routine sterile fashion as well as the left posterior superior iliac spine. First after infiltration of 20 mL lidocaine with epi in both incisions the posterior superior iliac spine and iliac crest harvest site was opened up with an incision starting 4 fingerbreadths off the midline extending over the PSIS. Dissection was carried out over the posterior  superior iliac spine and using, recent chisels and gouges the outer cortical surface was removed gouges dissected out cancellus bone from within with an adequate amount cancellus bone was achieved this was impacted dry Gelfoam a Hemovac drain copiously irrigated and closed with interrupted Vicryls and a running 4 subcuticular. Dermabond benzo and Steri-Strips were applied and this wound was dressed away from the field. The midline incision was then opened up subperiosteal dissections care off the lamina of T8 down to L2 exposing the old construct from L2 down to just below L3. TPs were exposed at L1 and L2 as well as lateral facets from T12 up to T9. After adequate exposure been achieved using combination of AP and lateral fluoroscopy pilot holes were drilled all pedicles were cannulated probed O55 tap at T11-12 and L1 and L4-5 tap at T9 and T10 all pedicles were be competent.all screws were placed with a 6 5 x 45 screws at L1 6 5 x 40 at T12 and T11 and 5 5 x 40 at T10 5 5 x 38 T9 on the left and 5 5 x 40 screw when lateral on the right at T9 so this was removed in attempt to be repositioned and decided to leave it out. After all screws in place stents taken the dissection to place the addition connecting system. Utilizing the integrated rod and 2 screw connector had been to some fashion this to attach the left side utilizing a independent double connector on the right I cut a new rod and placed this. All rods were contoured and kyphosis to all nuts were placed and anchored down the addition nuts were also anchored in place overlying the rod after everything and then secured medially prior to  rod placement however along the TPs from L2 up to L1 iliac crest bone graft and BMP was overlaid. Then after all the rods and tightened down and I placed the Allis stem morsels and strips from L1 up to T9 utilizing remainder of the iliac crest bone graft as well. Then placed a large Hemovac drain wounds and closed in layers  and the skin was closed running 4 subcuticular benzoin and Steri-Strips are applied patient recovered in stable condition. At the end of case on the account sponge counts were correct.

## 2015-06-24 NOTE — Transfer of Care (Signed)
Immediate Anesthesia Transfer of Care Note  Patient: Kristine [name]  Procedure(s) Performed: Procedure(s) with comments: Posterior Lateral Fusion - T9 -L2 with Iliac Crest Bone Graft (N/A) - Posterior Lateral Fusion - T9 -L2 with Iliac Crest Bone Graft  Patient Location: PACU  Anesthesia Type:General  Level of Consciousness: awake  Airway & Oxygen Therapy: Patient Spontanous Breathing and Patient connected to face mask oxygen  Post-op Assessment: Report given to RN and Post -op Vital signs reviewed and stable  Post vital signs: Reviewed and stable  Last Vitals:  Filed Vitals:   06/24/15 0704  BP: 144/75  Pulse: 63  Temp: 36.5 C  Resp: 18    Complications: No apparent anesthesia complications

## 2015-06-25 ENCOUNTER — Encounter (HOSPITAL_COMMUNITY): Payer: Self-pay | Admitting: Neurosurgery

## 2015-06-25 MED ORDER — SENNA 8.6 MG PO TABS
1.0000 | ORAL_TABLET | Freq: Every day | ORAL | Status: DC
  Administered 2015-06-25 – 2015-06-27 (×3): 8.6 mg via ORAL
  Filled 2015-06-25 (×3): qty 1

## 2015-06-25 MED ORDER — LACTATED RINGERS IV SOLN
INTRAVENOUS | Status: DC
  Administered 2015-06-25: 19:00:00 via INTRAVENOUS

## 2015-06-25 MED ORDER — DOCUSATE SODIUM 100 MG PO CAPS
100.0000 mg | ORAL_CAPSULE | Freq: Two times a day (BID) | ORAL | Status: DC
  Administered 2015-06-25 – 2015-06-28 (×6): 100 mg via ORAL
  Filled 2015-06-25 (×6): qty 1

## 2015-06-25 MED ORDER — SODIUM CHLORIDE 0.9 % IV BOLUS (SEPSIS)
500.0000 mL | Freq: Once | INTRAVENOUS | Status: AC
  Administered 2015-06-25: 500 mL via INTRAVENOUS

## 2015-06-25 MED FILL — Heparin Sodium (Porcine) Inj 1000 Unit/ML: INTRAMUSCULAR | Qty: 30 | Status: AC

## 2015-06-25 MED FILL — Sodium Chloride IV Soln 0.9%: INTRAVENOUS | Qty: 1000 | Status: AC

## 2015-06-25 NOTE — Evaluation (Signed)
Occupational Therapy Evaluation Patient Details Name: Kristine MinceDorothy [name] MRN: [id number] DOB: 12/24/1930 Today's Date: 06/25/2015    History of Present Illness 81 y.o. female s/p posterior lateral fusion T9-L2 with iliac crest bone graft; [name], asthma, arthritis, [name]   Clinical Impression   Patient is s/p T9-L2 posterior lateral fusion with iliac crest bone graft surgery resulting in functional limitations due to the deficits listed below (see OT problem list). Pt asking to get out of bed, stating she has not been up this morning or eaten breakfast. Pt has two braces in room, and unable to recall which is correct for use; orders checked and TLSO is correct brace.  Education and handout provided on back precautions. Pt required max A to don brace and +2 assist for transfers. Upon sitting EOB Pt reports feeling dizzy with BP 126/104; BP dropped to 105/65 in standing. Pt able to complete grooming task seated in recliner with set-up. Patient will benefit from skilled OT acutely to increase independence and safety with ADLS prior to discharge.     Follow Up Recommendations  Other (comment);SNF (TBD next session; Pt limited due to BP and pain)    Equipment Recommendations  None recommended by OT    Recommendations for Other Services       Precautions / Restrictions Precautions Precautions: Back;Fall Precaution Booklet Issued: Yes (comment) Precaution Comments: handout provided and reviewed Required Braces or Orthoses: Spinal Brace Spinal Brace: Thoracolumbosacral orthotic;Applied in sitting position Restrictions Weight Bearing Restrictions: No      Mobility Bed Mobility Overal bed mobility: Needs Assistance;+2 for physical assistance Bed Mobility: Rolling;Sidelying to Sit Rolling: Min assist Sidelying to sit: Mod assist;+2 for physical assistance       General bed mobility comments: Pt limited by pain  Transfers Overall transfer level: Needs  assistance Equipment used: Rolling walker (2 wheeled) Transfers: Sit to/from Stand Sit to Stand: Min assist;+2 physical assistance;From elevated surface         General transfer comment: VC's for hand placement and technique    Balance Overall balance assessment: Needs assistance Sitting-balance support: Single extremity supported;Feet supported Sitting balance-Leahy Scale: Fair     Standing balance support: Bilateral upper extremity supported                                ADL Overall ADL's : Needs assistance/impaired Eating/Feeding: Independent;Sitting   Grooming: Wash/dry face;Oral care;Sitting;Set up   Upper Body Bathing: Minimal assitance;Sitting   Lower Body Bathing: With adaptive equipment;Sit to/from stand;Moderate assistance   Upper Body Dressing : Sitting;Maximal assistance (don brace)   Lower Body Dressing: Sit to/from stand;Cueing for back precautions;With adaptive equipment;Moderate assistance   Toilet Transfer: +2 for safety/equipment;Ambulation;BSC;RW;Minimal assistance   Toileting- Clothing Manipulation and Hygiene: Moderate assistance;Sit to/from stand   Tub/ Engineer, structuralhower Transfer: Moderate assistance;Ambulation;Rolling walker;Shower seat   Functional mobility during ADLs: Minimal assistance;+2 for safety/equipment;Rolling walker General ADL Comments: ADLs limited due to pain and BP levels at this time     Vision Vision Assessment?: No apparent visual deficits   Perception     Praxis      Pertinent Vitals/Pain Pain Assessment: 0-10 Pain Score: 8  Pain Location: back Pain Descriptors / Indicators: Aching;Grimacing;Cramping Pain Intervention(s): Limited activity within patient's tolerance;Monitored during session;Premedicated before session;Repositioned;Relaxation     Hand Dominance Right   Extremity/Trunk Assessment Upper Extremity Assessment Upper Extremity Assessment: Generalized weakness   Lower Extremity Assessment Lower  Extremity Assessment: LLE  deficits/detail LLE Deficits / Details: 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 Pt unable to flex L knee and requires elevated surface for transfers.       Communication Communication Communication: No difficulties   Cognition Arousal/Alertness: Awake/alert Behavior During Therapy: WFL for tasks assessed/performed Overall Cognitive Status: No family/caregiver present to determine baseline cognitive functioning Area of Impairment: Memory;Safety/judgement     Memory: Decreased recall of precautions;Decreased short-term memory   Safety/Judgement: Decreased awareness of safety     General Comments: pt lethargic; unable to recall which brace in room is correct   General Comments       Exercises       Shoulder Instructions      Home Living Family/patient expects to be discharged to:: Private residence Living Arrangements: Spouse/significant other Available Help at Discharge: Family;Available 24 hours/day Type of Home: House Home Access: Stairs to enter [employer] of Steps: 2 Entrance Stairs-Rails: None Home Layout: One level     Bathroom Shower/Tub: Tub/shower unit;Walk-in shower;Curtain Shower/tub characteristics: Engineer, building services: Standard     Home Equipment: Cane - single point;Bedside commode;Hand held shower head;Walker - 2 wheels          Prior Functioning/Environment Level of Independence: Independent             OT Diagnosis: Generalized weakness;Acute pain   OT Problem List: Decreased strength;Decreased activity tolerance;Impaired balance (sitting and/or standing);Decreased safety awareness;Decreased knowledge of use of DME or AE;Decreased knowledge of precautions;Pain   OT Treatment/Interventions: Self-care/ADL training;Therapeutic exercise;DME and/or AE instruction;Therapeutic activities;Patient/family education;Balance training    OT Goals(Current goals can be found in the care plan  section) Acute Rehab OT Goals Patient Stated Goal: to get out of bed OT Goal Formulation: With patient Time For Goal Achievement: 07/09/15 Potential to Achieve Goals: Good ADL Goals Pt Will Perform Grooming: with supervision;standing Pt Will Perform Lower Body Bathing: with adaptive equipment;sit to/from stand;with min guard assist Pt Will Perform Upper Body Dressing: with supervision;sitting (don brace) Pt Will Transfer to Toilet: with min assist;ambulating;bedside commode Pt Will Perform Tub/Shower Transfer: Shower transfer;with min guard assist;ambulating;rolling walker Additional ADL Goal #1: Pt will verbalize 3/3 precautions as precursor to ADLs  OT Frequency: Min 2X/week   Barriers to D/C:            Co-evaluation              End of Session Equipment Utilized During Treatment: Gait belt;Back brace;Oxygen Nurse Communication: Mobility status;Precautions;Patient requests pain meds  Activity Tolerance: Patient limited by fatigue;Patient limited by pain Patient left: in chair;with call bell/phone within reach;with chair alarm set;with nursing/sitter in room   Time: 1008-1040 OT Time Calculation (min): 32 min Charges:  OT General Charges $OT Visit: 1 Procedure OT Evaluation $Initial OT Evaluation Tier I: 1 Procedure OT Treatments $Self Care/Home Management : 8-22 mins G-Codes:    Marden Noble 06/25/2015, 11:36 AM

## 2015-06-25 NOTE — Progress Notes (Signed)
Subjective: Patient reports Doing well feels much better leg pain improved back pain manageable  Objective: Vital signs in last 24 hours: Temp:  [97.4 F (36.3 C)-100 F (37.8 C)] 100 F (37.8 C) (07/07 0611) Pulse Rate:  [83-109] 96 (07/07 0611) Resp:  [12-19] 16 (07/07 0611) BP: (91-117)/(57-102) 96/60 mmHg (07/07 0611) SpO2:  [95 %-100 %] 100 % (07/07 0611)  Intake/Output from previous day: 07/06 0701 - 07/07 0700 In: 1700 [I.V.:1700] Out: 1875 [Urine:1250; Drains:325; Blood:300] Intake/Output this shift:    Strength out of 5 wound clean dry and intact  Lab Results: No results for input(s): WBC, HGB, HCT, PLT in the last 72 hours. BMET No results for input(s): NA, K, CL, CO2, GLUCOSE, BUN, CREATININE, CALCIUM in the last 72 hours.  Studies/Results: Dg Lumbar Spine 2-3 Views  06/24/2015   CLINICAL DATA:  Posterior fusion of lumbar spine.  EXAM: DG C-ARM 61-120 MIN; LUMBAR SPINE - 2-3 VIEW  COMPARISON:  May 05, 2015.  FLUOROSCOPY TIME:  1 minutes 28 seconds.  FINDINGS: Two intraoperative fluoroscopic images of the lower thoracic and upper lumbar spine demonstrate the patient be status post posterior surgical fusion of the visualized vertebral bodies with intrapedicular screw placement. Good alignment of visualized spine is noted.  IMPRESSION: Status post posterior fusion of lower thoracic and lumbar spine.   Electronically Signed   By: Lupita RaiderJames  Green Jr, M.D.   On: 06/24/2015 13:21   Dg C-arm 61-120 Min  06/24/2015   CLINICAL DATA:  Posterior fusion of lumbar spine.  EXAM: DG C-ARM 61-120 MIN; LUMBAR SPINE - 2-3 VIEW  COMPARISON:  May 05, 2015.  FLUOROSCOPY TIME:  1 minutes 28 seconds.  FINDINGS: Two intraoperative fluoroscopic images of the lower thoracic and upper lumbar spine demonstrate the patient be status post posterior surgical fusion of the visualized vertebral bodies with intrapedicular screw placement. Good alignment of visualized spine is noted.  IMPRESSION: Status post  posterior fusion of lower thoracic and lumbar spine.   Electronically Signed   By: Lupita RaiderJames  Green Jr, M.D.   On: 06/24/2015 13:21    Assessment/Plan: Mobilized today with physical and occupational therapy continue Hemovac drain  LOS: 1 day     Sargent Mankey P 06/25/2015, 7:21 AM

## 2015-06-25 NOTE — Progress Notes (Signed)
Utilization review completed.  

## 2015-06-25 NOTE — Progress Notes (Signed)
BP BEING TAKEN MANUALLY THIS SHIFT BECAUSE OF LOWER READINGS WITH DINAMAP. 500CC NS BOLUS GIVEN AT 1500. BLADDER SCAN REVEALS 180CC AT 1500 AND 277 AT 1830. PAGED NEURO MD ON CALL REGARDING FINDINGS.

## 2015-06-25 NOTE — Clinical Social Work Note (Signed)
CSW Consult Acknowledged:   CSW received a consult for SNF placement. CSW awaiting PT/OT evaluation to determine the appropriate level of care.      Likisha Alles, MSW, LCSWA [phone]  

## 2015-06-25 NOTE — Progress Notes (Signed)
PT Cancellation Note  Patient Details Name: Kristine [name] MRN: [id number] DOB: 04/29/1931   Cancelled Treatment:    Reason Eval/Treat Not Completed: Patient not medically ready (patient continues to have low BP at this time, bolus running)   Fabio AsaWerner, Malek Skog J 06/25/2015, 4:15 PM [name], PT DPT  [phone]

## 2015-06-25 NOTE — Progress Notes (Signed)
Pt reported feeling dizzy "like I'm going to black out" upon attempting to get out of chair to bed.  OT note states the same. Plus 2 person assist.  After returning to bed, BP 93/70 ;  SCD's on; pt comfortable in bed.  Illene Regulusaniel Gleber, RN notified.

## 2015-06-26 LAB — BASIC METABOLIC PANEL
Anion gap: 4 — ABNORMAL LOW (ref 5–15)
BUN: 22 mg/dL — ABNORMAL HIGH (ref 6–20)
CALCIUM: 8.7 mg/dL — AB (ref 8.9–10.3)
CO2: 29 mmol/L (ref 22–32)
CREATININE: 1.51 mg/dL — AB (ref 0.44–1.00)
Chloride: 100 mmol/L — ABNORMAL LOW (ref 101–111)
GFR calc Af Amer: 36 mL/min — ABNORMAL LOW (ref >60)
GFR, EST NON AFRICAN AMERICAN: 31 mL/min — AB (ref >60)
Glucose, Bld: 121 mg/dL — ABNORMAL HIGH (ref 65–99)
Potassium: 4.6 mmol/L (ref 3.5–5.1)
Sodium: 133 mmol/L — ABNORMAL LOW (ref 135–145)

## 2015-06-26 MED ORDER — DEXAMETHASONE SODIUM PHOSPHATE 4 MG/ML IJ SOLN
4.0000 mg | Freq: Four times a day (QID) | INTRAMUSCULAR | Status: AC
  Administered 2015-06-26 (×2): 4 mg via INTRAVENOUS
  Filled 2015-06-26 (×2): qty 1

## 2015-06-26 NOTE — Evaluation (Signed)
Physical Therapy Evaluation Patient Details Name: Kristine [name] MRN: [id number] DOB: 1931/05/21 Today's Date: 06/26/2015   History of Present Illness  81 y.o. female s/p posterior lateral fusion T9-L2 with iliac crest bone graft; [name], asthma, arthritis, [name]  Clinical Impression  Patient is s/p above surgery resulting in the deficits listed below (see PT Problem List). Pt ambulated 250' with RW and supervision for posture. She is progressing well with mobility. Will plan to do stair training next visit. Patient will benefit from skilled PT to increase their independence and safety with mobility (while adhering to their precautions) to allow discharge to the venue listed below.     Follow Up Recommendations No PT follow up    Equipment Recommendations  None recommended by PT    Recommendations for Other Services OT consult     Precautions / Restrictions Precautions Precautions: Back;Fall Precaution Booklet Issued: Yes (comment) Precaution Comments: pt did not recall back precautions, reviewed precautions and handout Required Braces or Orthoses: Spinal Brace Spinal Brace: Thoracolumbosacral orthotic;Applied in sitting position Restrictions Weight Bearing Restrictions: No      Mobility  Bed Mobility               General bed mobility comments: NT-up in recliner  Transfers Overall transfer level: Needs assistance Equipment used: Rolling walker (2 wheeled) Transfers: Sit to/from Stand Sit to Stand: +2 physical assistance;From elevated surface;Min guard         General transfer comment: VC's for hand placement and technique  Ambulation/Gait Ambulation/Gait assistance: Supervision Ambulation Distance (Feet): 250 Feet Assistive device: Rolling walker (2 wheeled) Gait Pattern/deviations: Step-through pattern;Trunk flexed   Gait velocity interpretation: at or above normal speed for age/gender General Gait Details: verbal cues for flexed posture  and to step into frame of RW, steady with no LOB  Stairs            Wheelchair Mobility    Modified Rankin (Stroke Patients Only)       Balance     Sitting balance-Leahy Scale: Fair     Standing balance support: Bilateral upper extremity supported                                 Pertinent Vitals/Pain Pain Score: 8  Pain Location: back Pain Descriptors / Indicators: Sore Pain Intervention(s): Limited activity within patient's tolerance;Monitored during session    Home Living Family/patient expects to be discharged to:: Private residence Living Arrangements: Spouse/significant other Available Help at Discharge: Family;Available 24 hours/day Type of Home: House Home Access: Stairs to enter Entrance Stairs-Rails: None Entrance Stairs-Number of Steps: 2 Home Layout: One level Home Equipment: Cane - single point;Bedside commode;Hand held shower head;Walker - 2 wheels      Prior Function Level of Independence: Independent               Hand Dominance   Dominant Hand: Right    Extremity/Trunk Assessment   Upper Extremity Assessment: Overall WFL for tasks assessed           Lower Extremity Assessment: LLE deficits/detail   LLE Deficits / Details: 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 Pt unable to flex L knee and requires elevated surface for transfers.     Communication   Communication: No difficulties  Cognition Arousal/Alertness: Awake/alert Behavior During Therapy: WFL for tasks assessed/performed Overall Cognitive Status: Within Functional Limits for tasks assessed  General Comments      Exercises        Assessment/Plan    PT Assessment Patient needs continued PT services  PT Diagnosis Acute pain;Difficulty walking   PT Problem List Decreased range of motion;Decreased activity tolerance;Pain;Decreased knowledge of use of DME;Decreased mobility;Decreased knowledge of  precautions  PT Treatment Interventions Gait training;DME instruction;Stair training;Functional mobility training;Therapeutic activities;Patient/family education;Balance training   PT Goals (Current goals can be found in the Care Plan section) Acute Rehab PT Goals Patient Stated Go[place] Goal Formulation: With patient Time For Goal Achievement: 07/03/15 Potential to Achieve Goals: Good    Frequency 7X/week   Barriers to discharge        Co-evaluation               End of Session Equipment Utilized During Treatment: Back brace Activity Tolerance: Patient tolerated treatment well;No increased pain Patient left: with call bell/phone within reach;Other (comment) (pt left on commode with OT assisting) Nurse Communication: Mobility status         Time: [id number] PT Time Calculation (min) (ACUTE ONLY): 18 min   Charges:   PT Evaluation $Initial PT Evaluation Tier I: 1 Procedure     PT G Codes:        Tamala SerUhlenberg, Jalena Vanderlinden Kistler 06/26/2015, 9:53 AM [phone]

## 2015-06-26 NOTE — Progress Notes (Signed)
Subjective: Patient reports Patient CONDITION of back pain no flatus yet no new numbness and tingling in her lower extremities urinating fine.  Objective: Vital signs in last 24 hours: Temp:  [98.2 F (36.8 C)-99.7 F (37.6 C)] 98.2 F (36.8 C) (07/08 0548) Pulse Rate:  [76-117] 88 (07/08 0548) Resp:  [16-20] 18 (07/08 0548) BP: (60-126)/(38-104) 96/52 mmHg (07/08 0300) SpO2:  [99 %-100 %] 100 % (07/08 0548)  Intake/Output from previous day: 07/07 0701 - 07/08 0700 In: -  Out: 1112 [Urine:752; Drains:360] Intake/Output this shift:    Strength 5 out of 5 wound clean dry and intact  Lab Results: No results for input(s): WBC, HGB, HCT, PLT in the last 72 hours. BMET  Recent Labs  06/26/15 0535  NA 133*  K 4.6  CL 100*  CO2 29  GLUCOSE 121*  BUN 22*  CREATININE 1.51*  CALCIUM 8.7*    Studies/Results: Dg Lumbar Spine 2-3 Views  06/24/2015   CLINICAL DATA:  Posterior fusion of lumbar spine.  EXAM: DG C-ARM 61-120 MIN; LUMBAR SPINE - 2-3 VIEW  COMPARISON:  May 05, 2015.  FLUOROSCOPY TIME:  1 minutes 28 seconds.  FINDINGS: Two intraoperative fluoroscopic images of the lower thoracic and upper lumbar spine demonstrate the patient be status post posterior surgical fusion of the visualized vertebral bodies with intrapedicular screw placement. Good alignment of visualized spine is noted.  IMPRESSION: Status post posterior fusion of lower thoracic and lumbar spine.   Electronically Signed   By: Lupita RaiderJames  Green Jr, M.D.   On: 06/24/2015 13:21   Dg C-arm 61-120 Min  06/24/2015   CLINICAL DATA:  Posterior fusion of lumbar spine.  EXAM: DG C-ARM 61-120 MIN; LUMBAR SPINE - 2-3 VIEW  COMPARISON:  May 05, 2015.  FLUOROSCOPY TIME:  1 minutes 28 seconds.  FINDINGS: Two intraoperative fluoroscopic images of the lower thoracic and upper lumbar spine demonstrate the patient be status post posterior surgical fusion of the visualized vertebral bodies with intrapedicular screw placement. Good alignment  of visualized spine is noted.  IMPRESSION: Status post posterior fusion of lower thoracic and lumbar spine.   Electronically Signed   By: Lupita RaiderJames  Green Jr, M.D.   On: 06/24/2015 13:21    Assessment/Plan: Postop day 2 from thoracic to lumbar stabilization to running mildly hypotensive responded slightly to fluid bolus yesterday we'll give her another 500 mL bolus this morning. Continue to hold antihypertensives she is only taking Ultram for pain so we can continue with the Ultram will hold her Flexeril will give her a couple doses of IV Decadron. Mobilized today with physical occupational therapy  LOS: 2 days     [name] P 06/26/2015, 7:17 AM

## 2015-06-26 NOTE — Progress Notes (Signed)
Pt accidentally pulled out one line of Hemovac during ambulation.  Site clean and dry and covered with gauze and tape.  Remaining line attached to new Hemovac suction port and functioning properly.

## 2015-06-26 NOTE — Progress Notes (Signed)
OOB TO BR PER WALKER 2 ASSIST TRANSFER. ABLE TO MAINTAIN BALANCE THROUGHOUT. GENERAL WEAKNESS.

## 2015-06-26 NOTE — Care Management (Signed)
Important Message  Patient Details  Name: [name] MRN: [id number] Date of Birth: October 17, 1931   Medicare Important Message Given:  Yes-second notification given    Anda KraftRobarge, Jovee Dettinger C, RN 06/26/2015, 11:03 AM

## 2015-06-26 NOTE — Progress Notes (Signed)
Occupational Therapy Treatment Patient Details Name: Kristine [name] MRN: [id number] DOB: May 23, 1931 Today's Date: 06/26/2015    History of present illness 81 y.o. female s/p posterior lateral fusion T9-L2 with iliac crest bone graft; [name], asthma, arthritis, [name]   OT comments  Pt in bathroom upon OT arrival. Pt required A to perform peri care. Pt attempting to leave RW when ambulating to sink. Pt unable to recall precautions or having a back handout (PT reviewed with pt prior to OT visit today). VC's required to maintain precautions during functional activity. Pt reports spouse is disabled, but can assist with ADLs at home as needed. [facility] to increase safety and independence with ADL completion.    Follow Up Recommendations  Home health OT;Supervision/Assistance - 24 hour    Equipment Recommendations  None recommended by OT    Recommendations for Other Services      Precautions / Restrictions Precautions Precautions: Back;Fall Precaution Booklet Issued: Yes (comment) Precaution Comments: pt unable to recall precautions, reviewed precautions and handout Required Braces or Orthoses: Spinal Brace Spinal Brace: Thoracolumbosacral orthotic;Applied in sitting position Restrictions Weight Bearing Restrictions: No       Mobility Bed Mobility               General bed mobility comments: pt in bathroom upon OT arrival  Transfers Overall transfer level: Needs assistance Equipment used: Rolling walker (2 wheeled) Transfers: Sit to/from Stand Sit to Stand: From elevated surface;Min assist ([facility])         General transfer comment: VC's for technique    Balance Overall balance assessment: Needs assistance Sitting-balance support: Feet supported;Single extremity supported Sitting balance-Leahy Scale: Fair     Standing balance support: Single extremity supported;During functional activity                       ADL  Overall ADL's : Needs assistance/impaired Eating/Feeding: Independent;Sitting   Grooming: Wash/dry hands;Minimal assistance;Standing                   Toilet Transfer: Minimal assistance;Ambulation;BSC;RW (cuing for precautions)   Toileting- Clothing Manipulation and Hygiene: Moderate assistance;Cueing for back precautions;Sit to/from stand       Functional mobility during ADLs: Minimal assistance;Rolling walker General ADL Comments: VCs to maintain precautions during ADLs      Vision                     Perception     Praxis      Cognition   Behavior During Therapy: [facility] for tasks assessed/performed Overall Cognitive Status: Within Functional Limits for tasks assessed Area of Impairment: Memory     Memory: Decreased recall of precautions;Decreased short-term memory          General Comments: Pt unable to recall precautions that PT reviewed 10 minues earlier    Extremity/Trunk Assessment  Upper Extremity Assessment Upper Extremity Assessment: Overall WFL for tasks assessed   Lower Extremity Assessment Lower Extremity Assessment: LLE deficits/detail LLE Deficits / Details: 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 Pt unable to flex L knee and requires elevated surface for transfers.        Exercises     Shoulder Instructions       General Comments      Pertinent Vitals/ Pain       Pain Assessment: 0-10 Pain Score: 6  Pain Location: back Pain Descriptors / Indicators: Sore Pain Intervention(s):  Limited activity within patient's tolerance;Repositioned  Home Living Family/patient expects to be discharged to:: Private residence Living Arrangements: Spouse/significant other Available Help at Discharge: Family;Available 24 hours/day Type of Home: House Home Access: Stairs to enter [employer] of Steps: 2 Entrance Stairs-Rails: None Home Layout: One level               Home Equipment: Cane - single  point;Bedside commode;Hand held shower head;Walker - 2 wheels          Prior Functioning/Environment Level of Independence: Independent            Frequency Min 2X/week     Progress Toward Goals  OT Goals(current goals can now be found in the care plan section)  Progress towards OT goals: Progressing toward goals  Acute Rehab OT Goals Patient Stated Goal: none stated OT Goal Formulation: With patient Time For Goal Achievement: 07/09/15 ADL Goals Pt Will Perform Grooming: with supervision;standing Pt Will Perform Lower Body Bathing: with adaptive equipment;sit to/from stand;with min guard assist Pt Will Perform Upper Body Dressing: with supervision;sitting Pt Will Transfer to Toilet: with min assist;ambulating;bedside commode Pt Will Perform Tub/Shower Transfer: Shower transfer;with min guard assist;ambulating;rolling walker Additional ADL Goal #1: Pt will verbalize 3/3 precautions as precursor to ADLs  Plan Discharge plan needs to be updated    Co-evaluation                 End of Session Equipment Utilized During Treatment: Gait belt;Rolling walker;Back brace   Activity Tolerance Patient tolerated treatment well;No increased pain   Patient Left in chair;with call bell/phone within reach;with nursing/sitter in room;with chair alarm set   Nurse Communication Mobility status;Precautions        Time: 0948-1000 OT Time Calculation (min): 12 min  Charges: OT General Charges $OT Visit: 1 Procedure OT Treatments $Self Care/Home Management : 8-22 mins  Marden Noble 06/26/2015, 11:40 AM

## 2015-06-27 MED ORDER — FLEET ENEMA 7-19 GM/118ML RE ENEM
1.0000 | ENEMA | Freq: Every day | RECTAL | Status: DC | PRN
  Filled 2015-06-27: qty 1

## 2015-06-27 NOTE — Progress Notes (Signed)
Patient ID: Kristine [name], female   DOB: [birth year], 81 y.o.   MRN: [id number] Doing really well. Ambulating with PT. No weakness. constipated

## 2015-06-27 NOTE — Progress Notes (Signed)
Physical Therapy Treatment Patient Details Name: Doy MinceDorothy Corporan MRN: [id number] DOB: [birth year] Today's Date: 06/27/2015    History of Present Illness 81 y.o. female s/p posterior lateral fusion T9-L2 with iliac crest bone graft; [name], asthma, arthritis, [name]    PT Comments    Pt progressing well. Stair training complete. Reports having BM this afternoon and hopeful for d/c home tomorrow.  Follow Up Recommendations  No PT follow up     Equipment Recommendations  None recommended by PT    Recommendations for Other Services       Precautions / Restrictions Precautions Precautions: Back;Fall Precaution Comments: Reviewed 3/3 back precautions. Required Braces or Orthoses: Spinal Brace Spinal Brace: Thoracolumbosacral orthotic;Applied in sitting position    Mobility  Bed Mobility     Rolling: Supervision Sidelying to sit: Min assist       General bed mobility comments: use of bedrail, demo good execution of logroll  Transfers   Equipment used: Rolling walker (2 wheeled)   Sit to Stand: Min guard         General transfer comment: verbal cues for hand placement  Ambulation/Gait Ambulation/Gait assistance: Supervision Ambulation Distance (Feet): 350 Feet Assistive device: Rolling walker (2 wheeled) Gait Pattern/deviations: Step-through pattern;Trunk flexed Gait velocity: decreased foot clearance LLE due to previous chronic injury to left knee Gait velocity interpretation: at or above normal speed for age/gender General Gait Details: verbal cues for posture   Stairs Stairs: Yes Stairs assistance: Min guard Stair Management: Two rails;Forwards Number of Stairs: 5 General stair comments: verbal cues for sequencing  Wheelchair Mobility    Modified Rankin (Stroke Patients Only)       Balance                                    Cognition Arousal/Alertness: Awake/alert Behavior During Therapy: WFL for tasks  assessed/performed Overall Cognitive Status: Within Functional Limits for tasks assessed                      Exercises      General Comments        Pertinent Vitals/Pain Pain Assessment: 0-10 Pain Score: 2  Pain Location: back Pain Descriptors / Indicators: Sore Pain Intervention(s): Monitored during session;Repositioned    Home Living                      Prior Function            PT Goals (current goals can now be found in the care plan section) Acute Rehab PT Goals Patient Stated Goal: home tomorrow PT Goal Formulation: With patient Time For Goal Achievement: 07/03/15 Potential to Achieve Goals: Good Progress towards PT goals: Progressing toward goals    Frequency  7X/week    PT Plan Current plan remains appropriate    Co-evaluation             End of Session Equipment Utilized During Treatment: Back brace;Gait belt Activity Tolerance: Patient tolerated treatment well Patient left: in chair;with call bell/phone within reach     Time: 1353-1417 PT Time Calculation (min) (ACUTE ONLY): 24 min  Charges:  $Gait Training: 23-37 mins                    G Codes:      Ilda FoilGarrow, Marrio Scribner Rene 06/27/2015, 2:22 PM

## 2015-06-27 NOTE — Progress Notes (Signed)
Occupational Therapy Treatment Patient Details Name: Kristine [name] MRN: [id number] DOB: 09-06-1931 Today's Date: 06/27/2015    History of present illness 81 y.o. female s/p posterior lateral fusion T9-L2 with iliac crest bone graft; [name], asthma, arthritis, [name]   OT comments   Pt. Able to perform safe toilet and walk in shower transfers.  Husband present and also educated on technique.  He reports he will be assisting with LB ADLS and pericare as needed.  Pt. Progressing well with acute OT goals and is clear for d/c from OT standpoint.  Will alert OTR/L.   Follow Up Recommendations  Home health OT;Supervision/Assistance - 24 hour    Equipment Recommendations  None recommended by OT    Recommendations for Other Services      Precautions / Restrictions Precautions Precautions: Back;Fall Precaution Comments: 1/3 recall initially, 3/3 at end of session Required Braces or Orthoses: Spinal Brace Spinal Brace: Thoracolumbosacral orthotic;Applied in sitting position       Mobility Bed Mobility               General bed mobility comments: in recliner upon arrival, husband reports a higher bed and that they have a towel they lie on and use as a chuck pad and he pulls on it to bring her towards center of bed if she does not achieve good positioning   Transfers Overall transfer level: Needs assistance Equipment used: Rolling walker (2 wheeled) Transfers: Sit to/from Stand Sit to Stand: Min guard         General transfer comment: VC's for technique, pt. continue to reach for walker after verbal and tactile cues to push from arm rests    Balance                                   ADL Overall ADL's : Needs assistance/impaired               Lower Body Bathing Details (indicate cue type and reason): declines need for a/e use, husband present and confirms he will be assisting with this prn       Lower Body Dressing Details (indicate cue  type and reason): declines need for a/e use, husband present and confirms he will be assisting with this prn Toilet Transfer: Min guard;Ambulation;RW;Comfort height toilet Toilet Transfer Details (indicate cue type and reason): simulated during in room transfers   Superior Details (indicate cue type and reason): pt. reports she is unable to assist, husband states he has assisted with this in the past and has purchased disposable gloves and will be helping upon d/c home also  Tub/ Shower Transfer: Walk-in shower;Minimal assistance;Cueing for sequencing;Anterior/posterior;Shower seat;Rolling walker   Functional mobility during ADLs: Minimal assistance;Rolling walker General ADL Comments: VCs to maintain precautions during ADLs, husband present and active in session.  will be assisting with LB ADLS and pericare prn      Vision                     Perception     Praxis      Cognition   Behavior During Therapy: [facility] for tasks assessed/performed Overall Cognitive Status: Within Functional Limits for tasks assessed                       Extremity/Trunk Assessment  Exercises     Shoulder Instructions       General Comments      Pertinent Vitals/ Pain       Pain Assessment: 0-10 Pain Score: 4  Pain Location: back Pain Descriptors / Indicators: Sore Pain Intervention(s): Premedicated before session  Home Living                                          Prior Functioning/Environment              Frequency Min 2X/week     Progress Toward Goals  OT Goals(current goals can now be found in the care plan section)  Progress towards OT goals: Goals met/education completed, patient discharged from [place] Discharge plan remains appropriate    Co-evaluation                 End of Session Equipment Utilized During Treatment: Gait belt;Rolling walker   Activity Tolerance Patient  tolerated treatment well   Patient Left in chair;with call bell/phone within reach;with family/visitor present   Nurse Communication          Time: [phone] OT Time Calculation (min): 25 min  Charges: OT General Charges $OT Visit: 1 Procedure OT Treatments $Self Care/Home Management : 23-37 mins  Janice Coffin, COTA/L 06/27/2015, 8:27 AM

## 2015-06-28 LAB — BASIC METABOLIC PANEL
Anion gap: 7 (ref 5–15)
BUN: 17 mg/dL (ref 6–20)
CHLORIDE: 103 mmol/L (ref 101–111)
CO2: 28 mmol/L (ref 22–32)
Calcium: 9.2 mg/dL (ref 8.9–10.3)
Creatinine, Ser: 1.29 mg/dL — ABNORMAL HIGH (ref 0.44–1.00)
GFR calc non Af Amer: 38 mL/min — ABNORMAL LOW (ref >60)
GFR, EST AFRICAN AMERICAN: 44 mL/min — AB (ref >60)
Glucose, Bld: 92 mg/dL (ref 65–99)
POTASSIUM: 4.4 mmol/L (ref 3.5–5.1)
Sodium: 138 mmol/L (ref 135–145)

## 2015-06-28 LAB — VANCOMYCIN, TROUGH: VANCOMYCIN TR: 19 ug/mL (ref 10.0–20.0)

## 2015-06-28 MED ORDER — HYDROCODONE-ACETAMINOPHEN 5-325 MG PO TABS: 1.0000 | ORAL_TABLET | Freq: Four times a day (QID) | ORAL | Status: DC | PRN

## 2015-06-28 NOTE — Progress Notes (Signed)
Physical Therapy Treatment Patient Details Name: Kristine [name] MRN: [id number] DOB: Apr 29, 1931 Today'[name] Date: 06/28/2015    History of Present Illness 81 y.o. female [name]; [name], asthma, arthritis, [name]    PT Comments    Continues to make good progress towards physical therapy goals. Ambulating with good stability up to 200 feet using a rolling walker at a supervision level for safety. Reviewed bed mobility and transfer techniques. States she feels confident with stair navigation from previous practice with PT. Adequate for d/c from PT standpoint. States her husband will provide any needed assist at home and she has no further concerns at this time.  Follow Up Recommendations  No PT follow up     Equipment Recommendations  None recommended by PT    Recommendations for Other Services OT consult     Precautions / Restrictions Precautions Precautions: Back;Fall Precaution Comments: Reviewed 3/3 back precautions. Required Braces or Orthoses: Spinal Brace Spinal Brace: Other (comment) (wearing a throacic extension brace) Restrictions Weight Bearing Restrictions: No    Mobility  Bed Mobility Overal bed mobility: Needs Assistance Bed Mobility: Rolling;Sidelying to Sit Rolling: Supervision Sidelying to sit: Min assist       General bed mobility comments: Supervision to roll with VC for hand use on rail. Min assist to rise pulling through PTs hand. Maintains back precautions  Transfers Overall transfer level: Needs assistance Equipment used: Rolling walker (2 wheeled) Transfers: Sit to/from Stand Sit to Stand: Min guard         General transfer comment: Close guard for safety. LLE extended due to hx of injury. VC for hand placement. Slow to rise but stable once upright with hands on RW. Performed from bed and recliner.   Ambulation/Gait Ambulation/Gait assistance: Supervision Ambulation Distance  (Feet): 200 Feet Assistive device: Rolling walker (2 wheeled) Gait Pattern/deviations: Step-through pattern;Decreased stride length;Trunk flexed Gait velocity: decreased   General Gait Details: VC for upright posture intermittently and walker placement for proximity. No loss of balance noted. Good control of RW with turns. No buckling noted. Lt knee with decreased flexion but demonstrates good foot clearance during swing phase of gait. Slightly guarded.   Stairs         General stair comments: States she feels comfortable with this task and does not wish to practice again  Wheelchair Mobility    Modified Rankin (Stroke Patients Only)       Balance                                    Cognition Arousal/Alertness: Awake/alert Behavior During Therapy: WFL for tasks assessed/performed Overall Cognitive Status: Within Functional Limits for tasks assessed                      Exercises      General Comments General comments (skin integrity, edema, etc.): Reviewed precautions, safety with mobility, and discussed all concerns with mobility pt had.      Pertinent Vitals/Pain Pain Assessment: 0-10 Pain Score: 2  Pain Location: back Pain Descriptors / Indicators: Sore;Spasm Pain Intervention([name]): Monitored during session;Repositioned    Home Living                      Prior Function            PT Goals (current goals can now be  found in the care plan section) Acute Rehab PT Goals PT Goal Formulation: With patient Time For Goal Achievement: 07/03/15 Potential to Achieve Goals: Good Progress towards PT goals: Progressing toward goals    Frequency  7X/week    PT Plan Current plan remains appropriate    Co-evaluation             End of Session Equipment Utilized During Treatment: Back brace;Gait belt Activity Tolerance: Patient tolerated treatment well Patient left: in chair;with call bell/phone within reach;with chair alarm  set     Time: [id number] PT Time Calculation (min) (ACUTE ONLY): 28 min  Charges:  $Gait Training: 8-22 mins $Therapeutic Activity: 8-22 mins                    G Codes:      Kristine [name], Kristine [name] 06/28/2015, 3:07 PM Sunday SpillersLogan Secor [place], [place] [phone]

## 2015-06-28 NOTE — Care Management Note (Signed)
Case Management Note  Patient Details  Name: Doy MinceDorothy Thorson MRN: [id number] Date of Birth: December 18, 1931  Subjective/Objective:                  Discharge Diagnoses: Lumbar instability and degenerative disc disease L1-L2 with degenerative on top of idiopathic scoliosis throughout her thoracic and lumbar spine  Action/Plan: CM spoke to patient at the bedside who states that she lives at home with her husband and sister is also in town and needs no further DME per previous note entered. [facility]. No further CM needs communicated.    Expected Discharge Date: 06/28/15              Expected Discharge Plan:  Home w Home Health Services  In-House Referral:     Discharge planning Services  CM Consult  Post Acute Care Choice:    Choice offered to:  Patient  DME Arranged:    DME Agency:     HH Arranged:  RN, OT HH Agency:  [facility]  Status of Service:  Completed, signed off  Medicare Important Message Given:  Yes-second notification given Date Medicare IM Given:    Medicare IM give by:    Date Additional Medicare IM Given:    Additional Medicare Important Message give by:     If discussed at Long Length of Stay Meetings, dates discussed:    Additional Comments:  Darcel SmallingAnna C Renee Erb, RN 06/28/2015, 1:33 PM

## 2015-06-28 NOTE — Progress Notes (Signed)
ANTIBIOTIC CONSULT NOTE - FOLLOW UP  Pharmacy Consult:  Vancomycin Indication:  Surgical prophylaxis  Allergies  Allergen Reactions  . Advair Diskus [Fluticasone-Salmeterol] Other (See Comments)    HR and BP go up  . Relafen [Nabumetone] Nausea Only  . Amoxicillin [Amoxicillin] Rash    Patient Measurements: Height: 5\' 5"  (165.1 cm) Weight: 240 lb 15.4 oz (109.3 kg) IBW/kg (Calculated) : 57  Vital Signs: Temp: 98.7 F (37.1 C) (07/10 1004) Temp Source: Oral (07/10 1004) BP: 95/39 mmHg (07/10 1004) Pulse Rate: 82 (07/10 1004) Intake/Output from previous day: 07/09 0701 - 07/10 0700 In: -  Out: 60 [Drains:60] Intake/Output from this shift: Total I/O In: -  Out: 10 [Drains:10]  Labs:  Recent Labs  06/26/15 0535 06/28/15 0936  CREATININE 1.51* 1.29*   Estimated Creatinine Clearance: 42.1 mL/min (by C-G formula based on Cr of 1.29).  Recent Labs  06/28/15 0936  [facility]     Microbiology: Recent Results (from the past 720 hour(s))  Surgical pcr screen     Status: None   Collection Time: 06/15/15  9:49 AM  Result Value Ref Range Status   MRSA, PCR NEGATIVE NEGATIVE Final   Staphylococcus aureus NEGATIVE NEGATIVE Final    Comment:        The Xpert SA Assay (FDA approved for NASAL specimens in patients over 10621 years of age), is one component of a comprehensive surveillance program.  Test performance has been validated by [facility] for patients greater than or equal to 79 year old. It is not intended to diagnose infection nor to guide or monitor treatment.       Assessment: [age] YOF continues on vancomycin for surgical prophylaxis s/p spinal surgery.  Patient's renal function is improving and her vancomycin trough is acceptable.  Now to be discharged.  Vanc 7/6 >>  7/10 VT = 19 mcg/mL on 1250mg  q24   Goal of Therapy: Vanc trough 15-20 mcg/mL   Plan: - Continue Vanc 1250mg  IV Q24H - Discharging    Tysheka Fanguy D. Laney Potashang, PharmD, BCPS Pager:   [phone] 06/28/2015, 1:34 PM

## 2015-06-28 NOTE — Discharge Summary (Signed)
Physician Discharge Summary  Patient ID: [name] MRN: [id number] DOB/AGE: June 06, 1931 81 y.o.  Admit date: 06/24/2015 Discharge date: 06/28/2015  Admission Diagnoses:Lumbar instability and degenerative disc disease L1-L2 with degenerative on top of idiopathic scoliosis throughout her thoracic and lumbar spine   Discharge Diagnoses: Lumbar instability and degenerative disc disease L1-L2 with degenerative on top of idiopathic scoliosis throughout her thoracic and lumbar spine  Active Problems:   DDD (degenerative disc disease), lumbar   Discharged Condition: good  Hospital Course: Admitted due to low back pain. She was taken to the operating room for both stabilization and decompression. Post op she has done well, voiding, ambulating, tolerating a regular diet. Her wound is clean, dry, and without signs of infection.  Treatments: surgery: #1 posterior thoracic to lumbar pedicle screw fixation with globus 6.35 [place] pedicle screw system from T9-L2 utilizing the globus addition set to tie into a pre-existing construct from L2-S1  #2 posterior lateral arthrodesis T9-L2 using iliac crest bone graft, BMP, and allostem morsels and strips  #3  through a separate skin incision harvesting of left posterior superior iliac crest bone graft   Discharge Exam: Blood pressure 95/39, pulse 82, temperature 98.7 F (37.1 C), temperature source Oral, resp. rate 18, height 5\' 5"  (1.651 m), weight 109.3 kg (240 lb 15.4 oz), SpO2 96 %. General appearance: alert, cooperative, appears stated age and no distress Neurologic: Grossly normal, moving all extremities well.  Disposition: 01-Home or Self Care stenosis    Medication List    STOP taking these medications        meloxicam 7.5 MG tablet  Commonly known as:  MOBIC      TAKE these medications        acetaminophen 500 MG tablet  Commonly known as:  TYLENOL  Take 1,000 mg by mouth every 4 (four) hours as needed. For leg burning      Calcium Carb-Cholecalciferol [phone] MG-UNIT Caps  Take 1 tablet by mouth 2 (two) times daily.     DULoxetine 60 MG capsule  Commonly known as:  CYMBALTA  Take 60 mg by mouth daily.     gabapentin 300 MG capsule  Commonly known as:  NEURONTIN  Take 600 mg by mouth 2 (two) times daily.     GLUCOSAMINE COMPLEX PO  Take 2,000 mg by mouth 2 (two) times daily.     HYDROcodone-acetaminophen 5-325 MG per tablet  Commonly known as:  NORCO/VICODIN  Take 1 tablet by mouth every 6 (six) hours as needed for moderate pain (mild pain).     montelukast 10 MG tablet  Commonly known as:  SINGULAIR  Take 10 mg by mouth at bedtime.     OMEGA 3-6-9 FATTY ACIDS PO  Take 1 capsule by mouth 2 (two) times daily.     omeprazole 40 MG capsule  Commonly known as:  PRILOSEC  Take 40 mg by mouth every morning.     polyethylene glycol powder powder  Commonly known as:  GLYCOLAX/MIRALAX  Take 17 g by mouth daily as needed. For constipation     ramipril 10 MG capsule  Commonly known as:  ALTACE  Take 10 mg by mouth every morning.     sertraline 50 MG tablet  Commonly known as:  ZOLOFT  Take 50 mg by mouth every morning.     simvastatin 40 MG tablet  Commonly known as:  ZOCOR  Take 40 mg by mouth at bedtime.     traMADol 50 MG tablet  Commonly known as:  ULTRAM  Take by mouth every 6 (six) hours as needed.           Follow-up Information    Follow up with CRAM,GARY P, MD In 2 weeks.   Specialty:  Neurosurgery   Why:  call office to make an appointment   Contact information:   [address] [place] [address] [phone]       Signed: Carmela Hurt 06/28/2015, 10:46 AM

## 2015-06-28 NOTE — Discharge Instructions (Signed)
Spinal Fusion Care After  These instructions give you information on caring for yourself after your procedure. Your doctor may also give you more specific instructions. Call your doctor if you have any problems or questions after your procedure. HOME CARE  Only take medicines as told by your doctor.  Do not drive if you are taking certain pain medicines (narcotics).  Change your bandage (dressing) as told by your doctor.  Do not get the wound wet. Do not take baths or swim until your doctor says it is okay.  Check the wound often for redness, puffiness (swelling), or leaking fluids.  Ask your doctor what activities you should avoid and for how long.  Walk as much as you can.  Do not sit for long periods of time. Change positions every hour.  Do not lift anything heavier than 5 to 10 pounds (2.25 to 4.5 kilograms) until your doctor says it is safe.  Do not twist or bend for 6 weeks. Try not to pull on things. Do not hold things away from your body.  Ask your doctor what exercises you should do. Exercises can help make the back stronger. GET HELP RIGHT AWAY IF:  Your pain suddenly gets worse.  The wound is red, puffy, bleeding, or leaking fluid.  Your legs or feet are painful, puffy, weak, or lose feeling (numb).  You cannot control when you pee (urinate) or poop (bowel movement).  You have trouble breathing.  You have chest pain.  You have a temperature by mouth above 102 F (38.9 C), not controlled by medicine. MAKE SURE YOU:  Understand these instructions.  Will watch your condition.  Will get help right away if you are not doing well or get worse. Document Released: 03/31/2011 Document Revised: 02/27/2012 Document Reviewed: 03/31/2011 [facility] Patient Information 2015 [place], [place]. This information is not intended to replace advice given to you by your health care provider. Make sure you discuss any questions you have with your health care provider. Spinal  Fusion Spinal fusion is a procedure to make 2 or more of the bones in your spinal column (vertebrae) grow together (fuse). This procedure stops movement between the vertebrae and can relieve pain and prevent deformity.  Spinal fusion is used to treat the following conditions:  Fractures of the spine.  Herniated disk (the spongy material [cartilage] between the vertebrae).  Abnormal curvatures of the spine, such as scoliosis or kyphosis.  A weak or an unstable spine, caused by infections or tumor. RISKS AND COMPLICATIONS Complications associated with spinal fusion are rare, but they can occur. Possible complications include:  Bleeding.  Infection near the incision.  Nerve damage. Signs of nerve damage are back pain, pain in one or both legs, weakness, or numbness.  Spinal fluid leakage.  Blood clot in your leg, which can move to your lungs.  Difficulty controlling urination or bowel movements. BEFORE THE PROCEDURE  A medical evaluation will be done. This will include a physical exam, blood tests, and imaging exams.  You will talk with an anesthesiologist. This is the person who will be in charge of the anesthesia during the procedure. Spinal fusion usually requires that you are asleep during the procedure (general anesthesia).  You will need to stop taking certain medicines, particularly those associated with an increased risk of bleeding. Ask your caregiver about changing or stopping your regular medicines.  If you smoke, you will need to stop at least 2 weeks before the procedure. Smoking can slow down the healing process,  especially fusion of the vertebrae, and increase the risk of complications.  Do not eat or drink anything for at least 8 hours before the procedure. PROCEDURE  A cut (incision) is made over the vertebrae that will be fused. The back muscles are separated from the vertebrae. If you are having this procedure to treat a herniated disk, the disc material pressing  on the nerve root is removed (decompression). The area where the disk is removed is then filled with extra bone. Bone from another part of your body (autogenous bone) or bone from a bone donor (allograft bone) may be used. The extra bone promotes fusion between the vertebrae. Sometimes, specific medicines are added to the fusion area to promote bone healing. In most cases, screws and rods or metal plates will be used to attach the vertebrae to stabilize them while they fuse.  AFTER THE PROCEDURE   You will stay in a recovery area until the anesthesia has worn off. Your blood pressure and pulse will be checked frequently.  You will be given antibiotics to prevent infection.  You may continue to receive fluids through an intravenous (IV) tube while you are still in the hospital.  Pain after surgery is normal. You will be given pain medicine.  You will be taught how to move correctly and how to stand and walk. While in bed, you will be instructed to turn frequently, using a "log rolling" technique, in which the entire body is moved without twisting the back. Document Released: 09/03/2003 Document Revised: 02/27/2012 Document Reviewed: 02/17/2011 [facility] Patient Information 2015 [place]. This information is not intended to replace advice given to you by your health care provider. Make sure you discuss any questions you have with your health care provider  Stop taking mobic

## 2015-06-28 NOTE — Progress Notes (Signed)
CM spoke to patient at the bedside who states that she has her husband and sister at home to assist. [facility] and [facility] and [facility]. CM [facility] and [facility] and advised of [facility]. CM spoke to RN John to advise that pt still needs face to face. Understanding verbalized and RN states they will outreach MD. Pt states that she has all DME needed at home including walker, BSC, and shower chair. CM will monitor for further needs.

## 2015-07-07 ENCOUNTER — Other Ambulatory Visit: Payer: Self-pay

## 2015-07-07 DIAGNOSIS — I82403 Acute embolism and thrombosis of unspecified deep veins of lower extremity, bilateral: Secondary | ICD-10-CM

## 2015-07-08 ENCOUNTER — Other Ambulatory Visit (HOSPITAL_COMMUNITY): Payer: Self-pay | Admitting: Neurosurgery

## 2015-07-08 ENCOUNTER — Ambulatory Visit (HOSPITAL_COMMUNITY)
Admission: RE | Admit: 2015-07-08 | Discharge: 2015-07-08 | Disposition: A | Discharge status: Home / Self Care | Payer: Medicare HMO | Source: Ambulatory Visit | Attending: Vascular Surgery | Admitting: Vascular Surgery

## 2015-07-08 DIAGNOSIS — I82409 Acute embolism and thrombosis of unspecified deep veins of unspecified lower extremity: Secondary | ICD-10-CM

## 2015-11-05 ENCOUNTER — Telehealth (HOSPITAL_BASED_OUTPATIENT_CLINIC_OR_DEPARTMENT_OTHER): Payer: Self-pay | Admitting: Emergency Medicine

## 2016-05-23 ENCOUNTER — Ambulatory Visit: Payer: Medicare HMO | Admitting: Student

## 2018-09-28 ENCOUNTER — Other Ambulatory Visit: Payer: Self-pay

## 2020-12-19 HISTORY — PX: FOOT SURGERY: SHX648

## 2021-08-30 ENCOUNTER — Other Ambulatory Visit: Payer: Self-pay

## 2021-08-30 ENCOUNTER — Ambulatory Visit (INDEPENDENT_AMBULATORY_CARE_PROVIDER_SITE_OTHER): Payer: Medicare HMO

## 2021-08-30 ENCOUNTER — Ambulatory Visit (INDEPENDENT_AMBULATORY_CARE_PROVIDER_SITE_OTHER): Payer: Medicare HMO | Admitting: Physician Assistant

## 2021-08-30 DIAGNOSIS — M25571 Pain in right ankle and joints of right foot: Secondary | ICD-10-CM

## 2021-08-30 MED ORDER — METHYLPREDNISOLONE ACETATE 40 MG/ML IJ SUSP
40.0000 mg | INTRAMUSCULAR | Status: AC | PRN
  Administered 2021-08-30: 40 mg via INTRA_ARTICULAR

## 2021-08-30 MED ORDER — LIDOCAINE HCL 1 % IJ SOLN
1.0000 mL | INTRAMUSCULAR | Status: AC | PRN
  Administered 2021-08-30: 1 mL

## 2021-08-30 NOTE — Progress Notes (Signed)
Office Visit Note   Patient: Kristine [name]           Date of Birth: [birth year]           MRN: [id number] Visit Date: 08/30/2021              Requested by: Brooke Bonito, MD No address on file PCP: Brooke Bonito, MD  Chief Complaint  Patient presents with   Right Ankle - Pain   Right Foot - Pain      HPI: Is a pleasant 87 year old woman with a chief complaint of painful lateral right foot.  She says this began in January when she was caregiving for her husband.  She felt a loud pop.  Following that she noticed her arch seemed to collapse.  She also had trouble turning her foot inward.  She takes Tylenol and cannot take anti-inflammatories she ambulates with a walker  Assessment & Plan: Visit Diagnoses:  1. Pain in right ankle and joints of right foot     Plan: Findings consistent with posterior tibial tendon dysfunction.  She was extremely tender over the subtalar joint laterally with the impingement and with manipulation of the joint.  We will forward with an injection today.  Also will give her a posterior tibial tendon brace.  Given her collapse and progression we did speak she may very well need a subtalar and talonavicular arthrodesis we also talked about wearing a supportive shoe with an arch support  Follow-Up Instructions: No follow-ups on file.   Ortho Exam  Patient is alert, oriented, no adenopathy, well-dressed, normal affect, normal respiratory effort. Examination of her foot no swelling no cellulitis.  She does have an easily palpable dorsalis pedis pulse.  She has pes planus collapse with prominence of the medial pole of the navicular.  She is unable to do any resisted inversion. She is tender over the sinus tarsi and with manipulation of the subtalar joint.  She has no tenderness with ankle dorsiflexion and plantarflexion.  No tenderness over the lateral medial joint.  No tenderness in the midfoot.  No signs of infection Imaging: No results found. No  images are attached to the encounter.  Labs: No results found for: HGBA1C, ESRSEDRATE, CRP, LABURIC, REPTSTATUS, GRAMSTAIN, CULT, LABORGA   No results found for: ALBUMIN, PREALBUMIN, CBC  No results found for: MG No results found for: VD25OH  No results found for: PREALBUMIN CBC EXTENDED Latest Ref Rng & Units 06/15/2015 11/17/2011 11/16/2011  WBC 4.0 - 10.5 K/uL 6.2 10.6(H) 9.5  RBC 3.87 - 5.11 MIL/uL 3.74(L) 2.80(L) 2.14(L)  HGB 12.0 - 15.0 g/dL 11.6(L) 8.6(L) 6.5(LL)  HCT 36.0 - 46.0 % 35.7(L) 25.6(L) 20.6(L)  PLT 150 - 400 K/uL 218 147(L) 157  NEUTROABS 1.7 - 7.7 K/uL - 7.8(H) -  LYMPHSABS 0.7 - 4.0 K/uL - 1.8 -     There is no height or weight on file to calculate BMI.  Orders:  Orders Placed This Encounter  Procedures   XR Foot 2 Views Right   XR Ankle 2 Views Right   No orders of the defined types were placed in this encounter.    Procedures: Small Joint Inj: R subtalar on 08/30/2021 10:39 AM Indications: pain and diagnostic evaluation Details: 22 G needle  Spinal Needle: No  Medications: 1 mL lidocaine 1 %; 40 mg methylPREDNISolone acetate 40 MG/ML Outcome: tolerated well, no immediate complications Consent was given by the patient. Immediately prior to procedure a time out was called to verify the  correct patient, procedure, equipment, support staff and site/side marked as required.     Clinical Data: No additional findings.  ROS:  All other systems negative, except as noted in the HPI. Review of Systems  Objective: Vital Signs: LMP  (LMP Unknown)   Specialty Comments:  No specialty comments available.  PMFS History: Patient Active Problem List   Diagnosis Date Noted   DDD (degenerative disc disease), lumbar 06/24/2015   Past Medical History:  Diagnosis Date   Arthritis    Asthma    Bronchitis    Cellulitis    left leg   GERD (gastroesophageal reflux disease)    Hiatal hernia    Hypertension    On home oxygen therapy 2 liters   at  night   Oxygen decrease 2010   during colonoscopy and elbow surgery 4 yrs ago.    Pneumonia    hx of   Shortness of breath     No family history on file.  Past Surgical History:  Procedure Laterality Date   BACK SURGERY     CARPAL TUNNEL RELEASE Bilateral 80's   COLONOSCOPY     FRACTURE SURGERY Right    elbow   HIP ARTHROPLASTY Right    JOINT REPLACEMENT  right knee   OVARIAN CYST REMOVAL  50's   POSTERIOR LUMBAR FUSION 4 LEVEL N/A 06/24/2015   Procedure: Posterior Lateral Fusion - T9 -L2 with Iliac Crest Bone Graft;  Surgeon: Donalee Citrin, MD;  Location: [facility];  Service: Neurosurgery;  Laterality: N/A;  Posterior Lateral Fusion - T9 -L2 with Iliac Crest Bone Graft   ROTATOR CUFF REPAIR Right    VEIN SURGERY Left 1970s   leg    Social History   Occupational History   Not on file  Tobacco Use   Smoking status: Former    Types: Cigarettes   Smokeless tobacco: Never  Substance and Sexual Activity   Alcohol use: No   Drug use: No   Sexual activity: Yes    Birth control/protection: Post-menopausal

## 2021-09-22 ENCOUNTER — Encounter: Payer: Self-pay | Admitting: Family

## 2021-09-22 ENCOUNTER — Ambulatory Visit (INDEPENDENT_AMBULATORY_CARE_PROVIDER_SITE_OTHER): Payer: Medicare HMO | Admitting: Family

## 2021-09-22 DIAGNOSIS — M76821 Posterior tibial tendinitis, right leg: Secondary | ICD-10-CM

## 2021-09-22 NOTE — Progress Notes (Signed)
Office Visit Note   Patient: Kristine [name]           Date of Birth: [birth year]           MRN: [id number] Visit Date: 08/30/2021              Requested by: Brooke Bonito, MD No address on file PCP: Brooke Bonito, MD  Chief Complaint  Patient presents with   Right Ankle - Pain   Right Foot - Pain      HPI: The patient is an 87 year old woman who is ambulates with a rolling walker.  She is seen today in follow-up for posterior tibial tendon dysfunction on the right.  She has been in a posterior tibial tendon brace for the last several weeks.  She states she also has obtained some new supportive walking shoes.  When she is wearing either her new shoes or her brace she has great relief of her pain she still does have some discomfort but she feels it is tolerable.   Was initially complaining of painful lateral right foot.  She says this began in January when she was caregiving for her husband.  She felt a loud pop.  Following that she noticed her arch seemed to collapse.  She also had trouble turning her foot inward.  She takes Tylenol and cannot take anti-inflammatories   Assessment & Plan: Visit Diagnoses:  1. Pain in right ankle and joints of right foot     Plan: posterior tibial tendon dysfunction: Discussed that she may change of her from the brace to her supportive shoe wear if this is comfortable for her.    Again discussed that given her collapse and progression she may very well need a subtalar and talonavicular arthrodesis   At this time she would like to proceed with conservative measures.  She will follow-up with [place] as needed.  Follow-Up Instructions: No follow-ups on file.   Ortho Exam  Patient is alert, oriented, no adenopathy, well-dressed, normal affect, normal respiratory effort. Examination of her foot no swelling no cellulitis.  She does have an easily palpable dorsalis pedis pulse.  She has pes planus collapse with prominence of the medial pole of  the navicular.  She is unable to do any resisted inversion. She is tender over the sinus tarsi and with manipulation of the subtalar joint.  She has no tenderness with ankle dorsiflexion and plantarflexion.  No tenderness over the lateral medial joint.  No tenderness in the midfoot.  No signs of infection Imaging: No results found. No images are attached to the encounter.  Labs: No results found for: HGBA1C, ESRSEDRATE, CRP, LABURIC, REPTSTATUS, GRAMSTAIN, CULT, LABORGA   No results found for: ALBUMIN, PREALBUMIN, CBC  No results found for: MG No results found for: VD25OH  No results found for: PREALBUMIN CBC EXTENDED Latest Ref Rng & Units 06/15/2015 11/17/2011 11/16/2011  WBC 4.0 - 10.5 K/uL 6.2 10.6(H) 9.5  RBC 3.87 - 5.11 MIL/uL 3.74(L) 2.80(L) 2.14(L)  HGB 12.0 - 15.0 g/dL 11.6(L) 8.6(L) 6.5(LL)  HCT 36.0 - 46.0 % 35.7(L) 25.6(L) 20.6(L)  PLT 150 - 400 K/uL 218 147(L) 157  NEUTROABS 1.7 - 7.7 K/uL - 7.8(H) -  LYMPHSABS 0.7 - 4.0 K/uL - 1.8 -     There is no height or weight on file to calculate BMI.  Orders:  Orders Placed This Encounter  Procedures   XR Foot 2 Views Right   XR Ankle 2 Views Right   No orders of the  defined types were placed in this encounter.    Procedures: No procedures performed  Clinical Data: No additional findings.  ROS:  All other systems negative, except as noted in the HPI. Review of Systems  Objective: Vital Signs: LMP  (LMP Unknown)   Specialty Comments:  No specialty comments available.  PMFS History: Patient Active Problem List   Diagnosis Date Noted   DDD (degenerative disc disease), lumbar 06/24/2015   Past Medical History:  Diagnosis Date   Arthritis    Asthma    Bronchitis    Cellulitis    left leg   GERD (gastroesophageal reflux disease)    Hiatal hernia    Hypertension    On home oxygen therapy 2 liters   at night   Oxygen decrease 2010   during colonoscopy and elbow surgery 4 yrs ago.    Pneumonia     hx of   Shortness of breath     No family history on file.  Past Surgical History:  Procedure Laterality Date   BACK SURGERY     CARPAL TUNNEL RELEASE Bilateral 80's   COLONOSCOPY     FRACTURE SURGERY Right    elbow   HIP ARTHROPLASTY Right    JOINT REPLACEMENT  right knee   OVARIAN CYST REMOVAL  50's   POSTERIOR LUMBAR FUSION 4 LEVEL N/A 06/24/2015   Procedure: Posterior Lateral Fusion - T9 -L2 with Iliac Crest Bone Graft;  Surgeon: Donalee Citrin, MD;  Location: [facility];  Service: Neurosurgery;  Laterality: N/A;  Posterior Lateral Fusion - T9 -L2 with Iliac Crest Bone Graft   ROTATOR CUFF REPAIR Right    VEIN SURGERY Left 1970s   leg    Social History   Occupational History   Not on file  Tobacco Use   Smoking status: Former    Types: Cigarettes   Smokeless tobacco: Never  Substance and Sexual Activity   Alcohol use: No   Drug use: No   Sexual activity: Yes    Birth control/protection: Post-menopausal

## 2022-01-27 ENCOUNTER — Other Ambulatory Visit: Payer: Self-pay

## 2022-01-27 ENCOUNTER — Ambulatory Visit (INDEPENDENT_AMBULATORY_CARE_PROVIDER_SITE_OTHER): Payer: Medicare HMO | Admitting: Orthopedic Surgery

## 2022-01-27 DIAGNOSIS — I878 Other specified disorders of veins: Secondary | ICD-10-CM

## 2022-01-27 DIAGNOSIS — M76821 Posterior tibial tendinitis, right leg: Secondary | ICD-10-CM | POA: Diagnosis not present

## 2022-01-28 ENCOUNTER — Encounter: Payer: Self-pay | Admitting: Orthopedic Surgery

## 2022-01-28 NOTE — Progress Notes (Signed)
Office Visit Note   Patient: Kristine [name]           Date of Birth: 01-28-1931           MRN: [id number] Visit Date: 01/27/2022              Requested by: Brooke Bonito, MD No address on file PCP: Brooke Bonito, MD  Chief Complaint  Patient presents with   Right Foot - Follow-up      HPI: Patient is an 88 year old woman who presents in follow-up for posterior tibial tendon insufficiency.  She is currently wearing slippers.  She states the posterior tibial tendon brace was not helpful.  She currently uses a rolling walker she states she cannot stand to bear weight on her foot.  Patient states she would like to consider surgery.  Assessment & Plan: Visit Diagnoses:  1. Posterior tibial tendon dysfunction (PTTD) of right lower extremity   2. Venous stasis of both lower extremities     Plan: We will plan for a subtalar and talonavicular fusion at [facility].  Risks and benefits were discussed including risk of the bone not healing risk of the skin not healing potential for additional surgery.  Patient states she understands wishes to proceed at this time.  Recommended a size large knee-high compression stocking for her venous insufficiency.  Follow-Up Instructions: Return in about 2 weeks (around 02/10/2022).   Ortho Exam  Patient is alert, oriented, no adenopathy, well-dressed, normal affect, normal respiratory effort. Examination patient has a good dorsalis pedis pulse she has pronation and valgus of the forefoot she has pes planus there is no subtalar motion she has pain to palpation of the sinus Tarsi with lateral impingement.  She is short of breath seated.  She has venous stasis changes in both legs worse on the left than the right but no ulcers.  Patient's left calf measures 37 cm in circumference.  Imaging: No results found. No images are attached to the encounter.  Labs: No results found for: HGBA1C, ESRSEDRATE, CRP, LABURIC, REPTSTATUS, GRAMSTAIN, CULT,  LABORGA   No results found for: ALBUMIN, PREALBUMIN, CBC  No results found for: MG No results found for: VD25OH  No results found for: PREALBUMIN CBC EXTENDED Latest Ref Rng & Units 06/15/2015 11/17/2011 11/16/2011  WBC 4.0 - 10.5 K/uL 6.2 10.6(H) 9.5  RBC 3.87 - 5.11 MIL/uL 3.74(L) 2.80(L) 2.14(L)  HGB 12.0 - 15.0 g/dL 11.6(L) 8.6(L) 6.5(LL)  HCT 36.0 - 46.0 % 35.7(L) 25.6(L) 20.6(L)  PLT 150 - 400 K/uL 218 147(L) 157  NEUTROABS 1.7 - 7.7 K/uL - 7.8(H) -  LYMPHSABS 0.7 - 4.0 K/uL - 1.8 -     There is no height or weight on file to calculate BMI.  Orders:  No orders of the defined types were placed in this encounter.  No orders of the defined types were placed in this encounter.    Procedures: No procedures performed  Clinical Data: No additional findings.  ROS:  All other systems negative, except as noted in the HPI. Review of Systems  Objective: Vital Signs: LMP  (LMP Unknown)   Specialty Comments:  No specialty comments available.  PMFS History: Patient Active Problem List   Diagnosis Date Noted   DDD (degenerative disc disease), lumbar 06/24/2015   Past Medical History:  Diagnosis Date   Arthritis    Asthma    Bronchitis    Cellulitis    left leg   GERD (gastroesophageal reflux disease)    Hiatal  hernia    Hypertension    On home oxygen therapy 2 liters   at night   Oxygen decrease 2010   during colonoscopy and elbow surgery 4 yrs ago.    Pneumonia    hx of   Shortness of breath     History reviewed. No pertinent family history.  Past Surgical History:  Procedure Laterality Date   BACK SURGERY     CARPAL TUNNEL RELEASE Bilateral 80's   COLONOSCOPY     FRACTURE SURGERY Right    elbow   HIP ARTHROPLASTY Right    JOINT REPLACEMENT  right knee   OVARIAN CYST REMOVAL  50's   POSTERIOR LUMBAR FUSION 4 LEVEL N/A 06/24/2015   Procedure: Posterior Lateral Fusion - T9 -L2 with Iliac Crest Bone Graft;  Surgeon: Donalee Citrin, MD;  Location: [facility];  Service: Neurosurgery;  Laterality: N/A;  Posterior Lateral Fusion - T9 -L2 with Iliac Crest Bone Graft   ROTATOR CUFF REPAIR Right    VEIN SURGERY Left 1970s   leg    Social History   Occupational History   Not on file  Tobacco Use   Smoking status: Former    Types: Cigarettes   Smokeless tobacco: Never  Substance and Sexual Activity   Alcohol use: No   Drug use: No   Sexual activity: Yes    Birth control/protection: Post-menopausal

## 2022-02-16 ENCOUNTER — Encounter (HOSPITAL_COMMUNITY): Payer: Self-pay | Admitting: Orthopedic Surgery

## 2022-02-16 ENCOUNTER — Other Ambulatory Visit: Payer: Self-pay

## 2022-02-16 NOTE — Progress Notes (Addendum)
DUE TO COVID-19 ONLY ONE VISITOR IS ALLOWED TO COME WITH YOU AND STAY IN THE WAITING ROOM ONLY DURING PRE OP AND PROCEDURE DAY OF SURGERY.  ? ?Two VISITORS MAY VISIT WITH YOU AFTER SURGERY IN YOUR PRIVATE ROOM DURING VISITING HOURS ONLY! ? ?PCP - Dr Fleet Contras ?Cardiologist - n/a ? ?Chest x-ray - 02/22/21 CE ?EKG - 02/22/21 [facility]- requested tracing ?Stress Test - 12/28/10 ?ECHO - 6/1/222014 ?Cardiac Cath - n/a ? ?ICD Pacemaker/Loop - n/a ? ?Sleep Study -  n/a ?CPAP - none ?Uses oxygen - 2L prn and qhs ? ?Anesthesia review: Yes ? ?STOP now taking any Aspirin (unless otherwise instructed by your surgeon), Aleve, Naproxen, Ibuprofen, Motrin, Advil, Goody's, BC's, all herbal medications, fish oil, and all vitamins.  ? ?Coronavirus Screening ?Covid test n/a Ambulatory Surgery  ?Do you have any of the following symptoms:  ?Cough yes/no: No ?Fever (>100.88F)  yes/no: No ?Runny nose yes/no: No ?Sore throat yes/no: No ?Difficulty breathing/shortness of breath  Yes, not new ? ?Have you traveled in the last 14 days and where? yes/no: No ? ?Patient verbalized understanding of instructions that were given via phone.  ?

## 2022-02-16 NOTE — Anesthesia Preprocedure Evaluation (Addendum)
Anesthesia Evaluation  ?Patient identified by MRN, date of birth, ID band ?Patient awake ? ? ? ?Reviewed: ?Allergy & Precautions, NPO status , Patient's Chart, lab work & pertinent test results ? ?Airway ?Mallampati: I ? ?TM Distance: >3 FB ?Neck ROM: Full ? ? ? Dental ? ?(+) Edentulous Upper, Edentulous Lower ?  ?Pulmonary ?asthma , former smoker,  ?  ?breath sounds clear to auscultation ? ? ? ? ? ? Cardiovascular ?hypertension,  ?Rhythm:Regular Rate:Normal ? ? ?  ?Neuro/Psych ?  ? GI/Hepatic ?Neg liver ROS, hiatal hernia, GERD  ,  ?Endo/Other  ?negative endocrine ROS ? Renal/GU ?negative Renal ROS  ? ?  ?Musculoskeletal ? ?(+) Arthritis ,  ? Abdominal ?(+) + obese,   ?Peds ? Hematology ?negative hematology ROS ?(+)   ?Anesthesia Other Findings ? ? Reproductive/Obstetrics ? ?  ? ? ? ? ? ? ? ? ? ? ? ? ? ?  ?  ? ? ? ? ? ? ? ?Anesthesia Physical ?Anesthesia Plan ? ?ASA: 3 ? ?Anesthesia Plan: General  ? ?Post-op Pain Management: Regional block*  ? ?Induction: Intravenous ? ?PONV Risk Score and Plan: 4 or greater ? ?Airway Management Planned: LMA ? ?Additional Equipment: None ? ?Intra-op Plan:  ? ?Post-operative Plan: Extubation in OR ? ?Informed Consent: I have reviewed the patients History and Physical, chart, labs and discussed the procedure including the risks, benefits and alternatives for the proposed anesthesia with the patient or authorized representative who has indicated his/her understanding and acceptance.  ? ? ? ? ? ?Plan Discussed with: CRNA ? ?Anesthesia Plan Comments: (Breathing tx in short stay ? ?PAT note by Antionette Poles, PA-C: ?Pertinent history includes OSA with nocturnal hypoxemia on 2 L O2 nightly, obesity hypoventilation syndrome, asthma, CKD 3, HTN, bilateral stasis dermatitis.  Echo June 2022 showed LVEF = >70%, mild LVH, prolonged relaxation, normal wall motion, no significant valvular abnormalities. ? ?She will need day of surgery labs and evaluation. ? ?EKG  02/22/2021 (Care Everywhere): Sinus rhythm.  Rate 75.  Low voltage QRS, consider pulmonary disease or obesity. ? ?TTE 05/19/2021: ?SUMMARY  ?Previous study not available.  ?Image Quality: Technically difficult.  ?A injection of Definity contrast agent was performed to improve image  ?quality.  ?The left ventricular size is normal.  ?Mild left ventricular hypertrophy  ?Left?ventricular systolic function is normal.  ?LV ejection fraction = >70%.  ?Left ventricular filling pattern is prolonged relaxation.  ?The left ventricular wall motion is normal.  ?There was insufficient TR detected to calculate RV systolic pressure. ? ?CTA chest 02/22/2021: ?IMPRESSION:  ?1. No demonstrable pulmonary embolus. No thoracic aortic aneurysm or  ?dissection. There are foci of aortic atherosclerosis as well as foci  ?of great vessel and coronary artery calcification.  ? ?2. Bibasilar atelectasis. No edema or airspace opacity. No pleural  ?effusions. Stable 2 mm nodular opacity right upper lobe.  ? ?3. ?No evident adenopathy by size criteria. ? ? ?)  ? ? ? ? ?Anesthesia Quick Evaluation ? ?

## 2022-02-16 NOTE — Progress Notes (Addendum)
Anesthesia Chart Review: ?Same day workup ? ?88 year old female with medical history significant of OSA with nocturnal hypoxemia on 2 L O2 nightly, obesity hypoventilation syndrome, asthma, CKD 3, HTN, bilateral stasis dermatitis.  Echo June 2022 showed LVEF = >70%, mild LVH, prolonged relaxation, normal wall motion, no significant valvular abnormalities. ? ?She will need day of surgery labs and evaluation. ? ?EKG 02/22/2021 (Care Everywhere): Sinus rhythm.  Rate 75.  Low voltage QRS, consider pulmonary disease or obesity. ? ?TTE 05/19/2021: ?SUMMARY  ?Previous study not available.  ?Image Quality: Technically difficult.  ?A injection of Definity contrast agent was performed to improve image  ?quality.  ?The left ventricular size is normal.  ?Mild left ventricular hypertrophy  ?Left ventricular systolic function is normal.  ?LV ejection fraction = >70%.  ?Left ventricular filling pattern is prolonged relaxation.  ?The left ventricular wall motion is normal.  ?There was insufficient TR detected to calculate RV systolic pressure. ? ?CTA chest 02/22/2021: ?IMPRESSION:  ?1. No demonstrable pulmonary embolus. No thoracic aortic aneurysm or  ?dissection. There are foci of aortic atherosclerosis as well as foci  ?of great vessel and coronary artery calcification.  ? ?2. Bibasilar atelectasis. No edema or airspace opacity. No pleural  ?effusions. Stable 2 mm nodular opacity right upper lobe.  ? ?3.  No evident adenopathy by size criteria.   ? ? ? ?Karoline Caldwell, PA-C ?[facility] Short Stay [facility]/Anesthesiology ?Phone [phone] ?02/16/2022 10:57 AM ? ?

## 2022-02-17 ENCOUNTER — Other Ambulatory Visit: Payer: Self-pay | Admitting: Orthopedic Surgery

## 2022-02-17 DIAGNOSIS — M76821 Posterior tibial tendinitis, right leg: Secondary | ICD-10-CM

## 2022-02-18 ENCOUNTER — Ambulatory Visit (HOSPITAL_BASED_OUTPATIENT_CLINIC_OR_DEPARTMENT_OTHER): Payer: Medicare HMO | Admitting: Physician Assistant

## 2022-02-18 ENCOUNTER — Other Ambulatory Visit: Payer: Self-pay

## 2022-02-18 ENCOUNTER — Encounter (HOSPITAL_COMMUNITY)
Admission: RE | Disposition: A | Discharge status: Home Health | Payer: Self-pay | Source: Home / Self Care | Attending: Orthopedic Surgery

## 2022-02-18 ENCOUNTER — Encounter (HOSPITAL_COMMUNITY): Payer: Self-pay | Admitting: Orthopedic Surgery

## 2022-02-18 ENCOUNTER — Ambulatory Visit (HOSPITAL_COMMUNITY): Payer: Medicare HMO | Admitting: Physician Assistant

## 2022-02-18 ENCOUNTER — Observation Stay (HOSPITAL_COMMUNITY)
Admission: RE | Admit: 2022-02-18 | Discharge: 2022-02-21 | Disposition: A | Discharge status: Home Health | Payer: Medicare HMO | Attending: Orthopedic Surgery | Admitting: Orthopedic Surgery

## 2022-02-18 DIAGNOSIS — K449 Diaphragmatic hernia without obstruction or gangrene: Secondary | ICD-10-CM

## 2022-02-18 DIAGNOSIS — Z96651 Presence of right artificial knee joint: Secondary | ICD-10-CM | POA: Insufficient documentation

## 2022-02-18 DIAGNOSIS — Z96641 Presence of right artificial hip joint: Secondary | ICD-10-CM | POA: Insufficient documentation

## 2022-02-18 DIAGNOSIS — I10 Essential (primary) hypertension: Secondary | ICD-10-CM | POA: Insufficient documentation

## 2022-02-18 DIAGNOSIS — M76821 Posterior tibial tendinitis, right leg: Secondary | ICD-10-CM

## 2022-02-18 DIAGNOSIS — Z87891 Personal history of nicotine dependence: Secondary | ICD-10-CM | POA: Diagnosis not present

## 2022-02-18 DIAGNOSIS — M67873 Other specified disorders of tendon, right ankle and foot: Secondary | ICD-10-CM | POA: Diagnosis not present

## 2022-02-18 DIAGNOSIS — M1990 Unspecified osteoarthritis, unspecified site: Secondary | ICD-10-CM

## 2022-02-18 DIAGNOSIS — I878 Other specified disorders of veins: Secondary | ICD-10-CM | POA: Insufficient documentation

## 2022-02-18 DIAGNOSIS — M67971 Unspecified disorder of synovium and tendon, right ankle and foot: Secondary | ICD-10-CM

## 2022-02-18 DIAGNOSIS — J45909 Unspecified asthma, uncomplicated: Secondary | ICD-10-CM | POA: Insufficient documentation

## 2022-02-18 HISTORY — PX: FOOT ARTHRODESIS: SHX1655

## 2022-02-18 HISTORY — DX: Dependence on other enabling machines and devices: Z99.89

## 2022-02-18 LAB — BASIC METABOLIC PANEL
Anion gap: 9 (ref 5–15)
BUN: 25 mg/dL — ABNORMAL HIGH (ref 8–23)
CO2: 26 mmol/L (ref 22–32)
Calcium: 9.4 mg/dL (ref 8.9–10.3)
Chloride: 103 mmol/L (ref 98–111)
Creatinine, Ser: 1.33 mg/dL — ABNORMAL HIGH (ref 0.44–1.00)
GFR, Estimated: 38 mL/min — ABNORMAL LOW (ref >60)
Glucose, Bld: 112 mg/dL — ABNORMAL HIGH (ref 70–99)
Potassium: 4.1 mmol/L (ref 3.5–5.1)
Sodium: 138 mmol/L (ref 135–145)

## 2022-02-18 LAB — CBC
HCT: 36.1 % (ref 36.0–46.0)
Hemoglobin: 11.6 g/dL — ABNORMAL LOW (ref 12.0–15.0)
MCH: 31.7 pg (ref 26.0–34.0)
MCHC: 32.1 g/dL (ref 30.0–36.0)
MCV: 98.6 fL (ref 80.0–100.0)
Platelets: 191 10*3/uL (ref 150–400)
RBC: 3.66 MIL/uL — ABNORMAL LOW (ref 3.87–5.11)
RDW: 12.9 % (ref 11.5–15.5)
WBC: 6.5 10*3/uL (ref 4.0–10.5)
nRBC: 0 % (ref 0.0–0.2)

## 2022-02-18 LAB — SURGICAL PCR SCREEN
MRSA, PCR: NEGATIVE
Staphylococcus aureus: NEGATIVE

## 2022-02-18 SURGERY — FUSION, JOINT, FOOT
Anesthesia: General | Site: Foot | Laterality: Right

## 2022-02-18 MED ORDER — CEFAZOLIN SODIUM-DEXTROSE 1-4 GM/50ML-% IV SOLN
1.0000 g | Freq: Four times a day (QID) | INTRAVENOUS | Status: AC
  Administered 2022-02-18 – 2022-02-19 (×3): 1 g via INTRAVENOUS
  Filled 2022-02-18 (×3): qty 50

## 2022-02-18 MED ORDER — OXYCODONE HCL 5 MG PO TABS
5.0000 mg | ORAL_TABLET | ORAL | Status: DC | PRN
  Administered 2022-02-19: 10 mg via ORAL
  Filled 2022-02-18: qty 2

## 2022-02-18 MED ORDER — PHENYLEPHRINE HCL-NACL 20-0.9 MG/250ML-% IV SOLN
INTRAVENOUS | Status: DC | PRN
  Administered 2022-02-18: 20 ug/min via INTRAVENOUS

## 2022-02-18 MED ORDER — BISACODYL 10 MG RE SUPP: 10.0000 mg | Freq: Every day | RECTAL | Status: DC | PRN

## 2022-02-18 MED ORDER — OXYCODONE HCL 5 MG PO TABS
10.0000 mg | ORAL_TABLET | ORAL | Status: DC | PRN
  Administered 2022-02-19: 15 mg via ORAL
  Filled 2022-02-18: qty 3

## 2022-02-18 MED ORDER — FENTANYL CITRATE (PF) 100 MCG/2ML IJ SOLN: 25.0000 ug | Freq: Once | INTRAMUSCULAR | Status: AC

## 2022-02-18 MED ORDER — ORAL CARE MOUTH RINSE: 15.0000 mL | Freq: Once | OROMUCOSAL | Status: AC

## 2022-02-18 MED ORDER — SODIUM CHLORIDE 0.9 % IV SOLN: INTRAVENOUS | Status: DC

## 2022-02-18 MED ORDER — METHOCARBAMOL 1000 MG/10ML IJ SOLN
500.0000 mg | Freq: Four times a day (QID) | INTRAVENOUS | Status: DC | PRN
  Filled 2022-02-18: qty 5

## 2022-02-18 MED ORDER — BUPIVACAINE-EPINEPHRINE (PF) 0.5% -1:200000 IJ SOLN
INTRAMUSCULAR | Status: DC | PRN
  Administered 2022-02-18: 30 mL via PERINEURAL

## 2022-02-18 MED ORDER — GABAPENTIN 300 MG PO CAPS
600.0000 mg | ORAL_CAPSULE | Freq: Every day | ORAL | Status: DC
  Administered 2022-02-19 – 2022-02-21 (×3): 600 mg via ORAL
  Filled 2022-02-18 (×4): qty 2

## 2022-02-18 MED ORDER — ONDANSETRON HCL 4 MG/2ML IJ SOLN
INTRAMUSCULAR | Status: DC | PRN
  Administered 2022-02-18: 4 mg via INTRAVENOUS

## 2022-02-18 MED ORDER — SIMVASTATIN 20 MG PO TABS
40.0000 mg | ORAL_TABLET | Freq: Every day | ORAL | Status: DC
Start: 2022-02-18 — End: 2022-02-21
  Administered 2022-02-18 – 2022-02-20 (×3): 40 mg via ORAL
  Filled 2022-02-18 (×3): qty 2

## 2022-02-18 MED ORDER — ONDANSETRON HCL 4 MG/2ML IJ SOLN
INTRAMUSCULAR | Status: AC
  Filled 2022-02-18: qty 2

## 2022-02-18 MED ORDER — ALBUTEROL SULFATE (2.5 MG/3ML) 0.083% IN NEBU: 2.5000 mg | INHALATION_SOLUTION | Freq: Once | RESPIRATORY_TRACT | Status: AC

## 2022-02-18 MED ORDER — PROPOFOL 10 MG/ML IV BOLUS
INTRAVENOUS | Status: DC | PRN
  Administered 2022-02-18: 100 mg via INTRAVENOUS

## 2022-02-18 MED ORDER — ALBUTEROL SULFATE (2.5 MG/3ML) 0.083% IN NEBU
INHALATION_SOLUTION | RESPIRATORY_TRACT | Status: AC
  Administered 2022-02-18: 2.5 mg via RESPIRATORY_TRACT
  Filled 2022-02-18: qty 3

## 2022-02-18 MED ORDER — LIDOCAINE 2% (20 MG/ML) 5 ML SYRINGE
INTRAMUSCULAR | Status: DC | PRN
  Administered 2022-02-18: 40 mg via INTRAVENOUS

## 2022-02-18 MED ORDER — CHLORHEXIDINE GLUCONATE 0.12 % MT SOLN
15.0000 mL | Freq: Once | OROMUCOSAL | Status: AC
  Administered 2022-02-18: 15 mL via OROMUCOSAL
  Filled 2022-02-18: qty 15

## 2022-02-18 MED ORDER — OXYCODONE HCL 5 MG/5ML PO SOLN: 5.0000 mg | Freq: Once | ORAL | Status: DC | PRN

## 2022-02-18 MED ORDER — ALBUTEROL SULFATE (2.5 MG/3ML) 0.083% IN NEBU: 3.0000 mL | INHALATION_SOLUTION | Freq: Four times a day (QID) | RESPIRATORY_TRACT | Status: DC | PRN

## 2022-02-18 MED ORDER — MIDAZOLAM HCL 2 MG/2ML IJ SOLN: 1.0000 mg | Freq: Once | INTRAMUSCULAR | Status: AC

## 2022-02-18 MED ORDER — METOCLOPRAMIDE HCL 5 MG PO TABS: 5.0000 mg | ORAL_TABLET | Freq: Three times a day (TID) | ORAL | Status: DC | PRN

## 2022-02-18 MED ORDER — METOCLOPRAMIDE HCL 5 MG/ML IJ SOLN: 5.0000 mg | Freq: Three times a day (TID) | INTRAMUSCULAR | Status: DC | PRN

## 2022-02-18 MED ORDER — METHOCARBAMOL 500 MG PO TABS
500.0000 mg | ORAL_TABLET | Freq: Four times a day (QID) | ORAL | Status: DC | PRN
  Administered 2022-02-19 (×2): 500 mg via ORAL
  Filled 2022-02-18 (×2): qty 1

## 2022-02-18 MED ORDER — ACETAMINOPHEN 325 MG PO TABS: 325.0000 mg | ORAL_TABLET | Freq: Four times a day (QID) | ORAL | Status: DC | PRN

## 2022-02-18 MED ORDER — ACETAMINOPHEN 325 MG PO TABS: 325.0000 mg | ORAL_TABLET | ORAL | Status: DC | PRN

## 2022-02-18 MED ORDER — CEFAZOLIN SODIUM-DEXTROSE 2-4 GM/100ML-% IV SOLN
2.0000 g | INTRAVENOUS | Status: AC
  Administered 2022-02-18: 2 g via INTRAVENOUS
  Filled 2022-02-18: qty 100

## 2022-02-18 MED ORDER — FENTANYL CITRATE (PF) 100 MCG/2ML IJ SOLN
INTRAMUSCULAR | Status: AC
  Administered 2022-02-18: 25 ug via INTRAVENOUS
  Filled 2022-02-18: qty 2

## 2022-02-18 MED ORDER — ONDANSETRON HCL 4 MG/2ML IJ SOLN: 4.0000 mg | Freq: Four times a day (QID) | INTRAMUSCULAR | Status: DC | PRN

## 2022-02-18 MED ORDER — GABAPENTIN 300 MG PO CAPS
900.0000 mg | ORAL_CAPSULE | Freq: Every day | ORAL | Status: DC
  Administered 2022-02-18 – 2022-02-20 (×3): 900 mg via ORAL
  Filled 2022-02-18 (×3): qty 3

## 2022-02-18 MED ORDER — LACTATED RINGERS IV SOLN: INTRAVENOUS | Status: DC

## 2022-02-18 MED ORDER — LOSARTAN POTASSIUM 50 MG PO TABS
25.0000 mg | ORAL_TABLET | Freq: Every day | ORAL | Status: DC
  Administered 2022-02-18 – 2022-02-21 (×4): 25 mg via ORAL
  Filled 2022-02-18 (×4): qty 1

## 2022-02-18 MED ORDER — FENTANYL CITRATE (PF) 100 MCG/2ML IJ SOLN: 25.0000 ug | INTRAMUSCULAR | Status: DC | PRN

## 2022-02-18 MED ORDER — DOCUSATE SODIUM 100 MG PO CAPS
100.0000 mg | ORAL_CAPSULE | Freq: Two times a day (BID) | ORAL | Status: DC
  Administered 2022-02-18 – 2022-02-21 (×6): 100 mg via ORAL
  Filled 2022-02-18 (×7): qty 1

## 2022-02-18 MED ORDER — ACETAMINOPHEN 10 MG/ML IV SOLN: 1000.0000 mg | Freq: Once | INTRAVENOUS | Status: DC | PRN

## 2022-02-18 MED ORDER — IPRATROPIUM-ALBUTEROL 0.5-2.5 (3) MG/3ML IN SOLN
3.0000 mL | Freq: Every day | RESPIRATORY_TRACT | Status: DC | PRN
  Administered 2022-02-18 – 2022-02-19 (×2): 3 mL via RESPIRATORY_TRACT
  Filled 2022-02-18 (×3): qty 3

## 2022-02-18 MED ORDER — ONDANSETRON HCL 4 MG PO TABS: 4.0000 mg | ORAL_TABLET | Freq: Four times a day (QID) | ORAL | Status: DC | PRN

## 2022-02-18 MED ORDER — PROPOFOL 10 MG/ML IV BOLUS
INTRAVENOUS | Status: AC
  Filled 2022-02-18: qty 20

## 2022-02-18 MED ORDER — OXYCODONE HCL 5 MG PO TABS: 5.0000 mg | ORAL_TABLET | Freq: Once | ORAL | Status: DC | PRN

## 2022-02-18 MED ORDER — HYDROMORPHONE HCL 1 MG/ML IJ SOLN
0.5000 mg | INTRAMUSCULAR | Status: DC | PRN
  Administered 2022-02-19: 1 mg via INTRAVENOUS
  Filled 2022-02-18: qty 1

## 2022-02-18 MED ORDER — DULOXETINE HCL 60 MG PO CPEP
60.0000 mg | ORAL_CAPSULE | Freq: Every day | ORAL | Status: DC
  Administered 2022-02-18 – 2022-02-21 (×4): 60 mg via ORAL
  Filled 2022-02-18 (×4): qty 1

## 2022-02-18 MED ORDER — 0.9 % SODIUM CHLORIDE (POUR BTL) OPTIME
TOPICAL | Status: DC | PRN
  Administered 2022-02-18: 1000 mL

## 2022-02-18 MED ORDER — ALBUTEROL SULFATE HFA 108 (90 BASE) MCG/ACT IN AERS
INHALATION_SPRAY | RESPIRATORY_TRACT | Status: DC | PRN
  Administered 2022-02-18: 6 via RESPIRATORY_TRACT

## 2022-02-18 MED ORDER — PANTOPRAZOLE SODIUM 40 MG PO TBEC
40.0000 mg | DELAYED_RELEASE_TABLET | Freq: Every day | ORAL | Status: DC
  Administered 2022-02-18 – 2022-02-21 (×4): 40 mg via ORAL
  Filled 2022-02-18 (×4): qty 1

## 2022-02-18 MED ORDER — POLYETHYLENE GLYCOL 3350 17 G PO PACK: 17.0000 g | PACK | Freq: Every day | ORAL | Status: DC | PRN

## 2022-02-18 MED ORDER — IPRATROPIUM-ALBUTEROL 0.5-2.5 (3) MG/3ML IN SOLN
RESPIRATORY_TRACT | Status: AC
  Filled 2022-02-18: qty 3

## 2022-02-18 MED ORDER — MIDAZOLAM HCL 2 MG/2ML IJ SOLN
INTRAMUSCULAR | Status: AC
  Administered 2022-02-18: 1 mg via INTRAVENOUS
  Filled 2022-02-18: qty 2

## 2022-02-18 MED ORDER — GABAPENTIN 300 MG PO CAPS: 600.0000 mg | ORAL_CAPSULE | ORAL | Status: DC

## 2022-02-18 MED ORDER — AMISULPRIDE (ANTIEMETIC) 5 MG/2ML IV SOLN: 10.0000 mg | Freq: Once | INTRAVENOUS | Status: DC | PRN

## 2022-02-18 MED ORDER — ACETAMINOPHEN 160 MG/5ML PO SOLN: 325.0000 mg | ORAL | Status: DC | PRN

## 2022-02-18 SURGICAL SUPPLY — 50 items
BAG COUNTER SPONGE SURGICOUNT (BAG) ×2 IMPLANT
BANDAGE ESMARK 6X9 LF (GAUZE/BANDAGES/DRESSINGS) IMPLANT
BIT DRILL 4.5 TYB 9 (BIT) ×1 IMPLANT
BLADE SAW SGTL HD 18.5X60.5X1. (BLADE) ×1 IMPLANT
BLADE SURG 10 STRL SS (BLADE) IMPLANT
BNDG COHESIVE 4X5 TAN STRL (GAUZE/BANDAGES/DRESSINGS) ×1 IMPLANT
BNDG COHESIVE 6X5 TAN NS LF (GAUZE/BANDAGES/DRESSINGS) ×1 IMPLANT
BNDG ESMARK 6X9 LF (GAUZE/BANDAGES/DRESSINGS) ×2
BNDG GAUZE ELAST 4 BULKY (GAUZE/BANDAGES/DRESSINGS) ×3 IMPLANT
BUR EGG ELITE 4.0 (BURR) ×1 IMPLANT
COTTON STERILE ROLL (GAUZE/BANDAGES/DRESSINGS) ×1 IMPLANT
COVER MAYO STAND STRL (DRAPES) IMPLANT
COVER SURGICAL LIGHT HANDLE (MISCELLANEOUS) ×3 IMPLANT
DRAPE INCISE IOBAN 66X45 STRL (DRAPES) ×2 IMPLANT
DRAPE OEC MINIVIEW 54X84 (DRAPES) ×1 IMPLANT
DRAPE U-SHAPE 47X51 STRL (DRAPES) ×2 IMPLANT
DRSG ADAPTIC 3X8 NADH LF (GAUZE/BANDAGES/DRESSINGS) ×2 IMPLANT
DRSG PAD ABDOMINAL 8X10 ST (GAUZE/BANDAGES/DRESSINGS) ×2 IMPLANT
DURAPREP 26ML APPLICATOR (WOUND CARE) ×2 IMPLANT
ELECT REM PT RETURN 9FT ADLT (ELECTROSURGICAL) ×2
ELECTRODE REM PT RTRN 9FT ADLT (ELECTROSURGICAL) ×1 IMPLANT
GAUZE SPONGE 4X4 12PLY STRL (GAUZE/BANDAGES/DRESSINGS) ×2 IMPLANT
GLOVE SURG ORTHO LTX SZ9 (GLOVE) ×3 IMPLANT
GLOVE SURG UNDER POLY LF SZ9 (GLOVE) ×3 IMPLANT
GOWN STRL REUS W/ TWL XL LVL3 (GOWN DISPOSABLE) ×3 IMPLANT
GOWN STRL REUS W/TWL LRG LVL3 (GOWN DISPOSABLE) ×1 IMPLANT
GOWN STRL REUS W/TWL XL LVL3 (GOWN DISPOSABLE) ×3
GRAFT SKIN WND MICRO 38 (Tissue) ×1 IMPLANT
GUIDEWIRE TYB 2X9 (WIRE) ×1 IMPLANT
KIT BASIN OR (CUSTOM PROCEDURE TRAY) ×2 IMPLANT
KIT STAPLE ARCUS 16X13 STRL (Staple) ×1 IMPLANT
KIT STAPLE ARCUS 20X17 STRL (Staple) ×1 IMPLANT
KIT TURNOVER KIT B (KITS) ×2 IMPLANT
MANIFOLD NEPTUNE II (INSTRUMENTS) ×2 IMPLANT
NS IRRIG 1000ML POUR BTL (IV SOLUTION) ×2 IMPLANT
PACK ORTHO EXTREMITY (CUSTOM PROCEDURE TRAY) ×2 IMPLANT
PAD ARMBOARD 7.5X6 YLW CONV (MISCELLANEOUS) ×3 IMPLANT
PAD CAST 4YDX4 CTTN HI CHSV (CAST SUPPLIES) ×1 IMPLANT
PADDING CAST COTTON 4X4 STRL (CAST SUPPLIES)
PUTTY DBM STAGRAFT PLUS 5CC (Putty) ×1 IMPLANT
SCREW CANN HDLS SHT 6.5X70 (Screw) ×1 IMPLANT
SPONGE T-LAP 18X18 ~~LOC~~+RFID (SPONGE) ×2 IMPLANT
SUCTION FRAZIER HANDLE 10FR (MISCELLANEOUS) ×1
SUCTION TUBE FRAZIER 10FR DISP (MISCELLANEOUS) ×1 IMPLANT
SUT ETHILON 2 0 PSLX (SUTURE) ×5 IMPLANT
TOWEL GREEN STERILE (TOWEL DISPOSABLE) ×2 IMPLANT
TOWEL GREEN STERILE FF (TOWEL DISPOSABLE) ×2 IMPLANT
TUBE CONNECTING 12X1/4 (SUCTIONS) ×2 IMPLANT
WATER STERILE IRR 1000ML POUR (IV SOLUTION) ×1 IMPLANT
YANKAUER SUCT BULB TIP NO VENT (SUCTIONS) ×1 IMPLANT

## 2022-02-18 NOTE — Transfer of Care (Signed)
Immediate Anesthesia Transfer of Care Note ? ?Patient: Kristine [name] ? ?Procedure(s) Performed: RIGHT TALONAVICULAR AND SUBTALAR FUSION (Right: Foot) ? ?Patient Location: PACU ? ?Anesthesia Type:General and Regional ? ?Level of Consciousness: drowsy ? ?Airway & Oxygen Therapy: Patient Spontanous Breathing and Patient connected to face mask oxygen ? ?Post-op Assessment: Report given to RN and Post -op Vital signs reviewed and stable ? ?Post vital signs: Reviewed and stable ? ?Last Vitals:  ?Vitals Value Taken Time  ?BP    ?Temp    ?Pulse    ?Resp 20 02/18/22 1050  ?SpO2    ?Vitals shown include unvalidated device data. ? ?Last Pain:  ?Vitals:  ? 02/18/22 0743  ?TempSrc:   ?PainSc: 3   ?   ? ?Patients Stated Pain Goal: 3 (02/18/22 0743) ? ?Complications: No notable events documented. ?

## 2022-02-18 NOTE — Anesthesia Postprocedure Evaluation (Signed)
Anesthesia Post Note ? ?Patient: Kristine [name] ? ?Procedure(s) Performed: RIGHT TALONAVICULAR AND SUBTALAR FUSION (Right: Foot) ? ?  ? ?Patient location during evaluation: PACU ?Anesthesia Type: General ?Level of consciousness: awake and alert ?Pain management: pain level controlled ?Vital Signs Assessment: post-procedure vital signs reviewed and stable ?Respiratory status: spontaneous breathing, nonlabored ventilation, respiratory function stable and patient connected to nasal cannula oxygen ?Cardiovascular status: blood pressure returned to baseline and stable ?Postop Assessment: no apparent nausea or vomiting ?Anesthetic complications: no ? ? ?No notable events documented. ? ?Last Vitals:  ?Vitals:  ? 02/18/22 1205 02/18/22 1220  ?BP: (!) 114/47 109/62  ?Pulse: (!) 59 (!) 59  ?Resp: 15 16  ?Temp:  36.4 ?C  ?SpO2: 100% 99%  ?  ?Last Pain:  ?Vitals:  ? 02/18/22 1220  ?TempSrc:   ?PainSc: 0-No pain  ? ? ?  ?  ?  ?  ?  ?  ? ?Effie Berkshire ? ? ? ? ?

## 2022-02-18 NOTE — Addendum Note (Signed)
Addendum  created 02/18/22 1323 by Kara Mead, CRNA  ? Charge Capture section accepted  ?  ?

## 2022-02-18 NOTE — Anesthesia Procedure Notes (Signed)
Procedure Name: LMA Insertion ?Date/Time: 02/18/2022 9:52 AM ?Performed by: Kara Mead, CRNA ?Pre-anesthesia Checklist: Patient identified, Emergency Drugs available, Suction available, Patient being monitored and Timeout performed ?Patient Re-evaluated:Patient Re-evaluated prior to induction ?Oxygen Delivery Method: Circle system utilized ?Preoxygenation: Pre-oxygenation with 100% oxygen ?Induction Type: IV induction ?LMA: LMA inserted ?LMA Size: 4.0 ?Number of attempts: 1 ?Tube secured with: Tape ?Dental Injury: Teeth and Oropharynx as per pre-operative assessment  ? ? ? ? ?

## 2022-02-18 NOTE — Anesthesia Procedure Notes (Signed)
Anesthesia Regional Block: Popliteal block  ? ?Pre-Anesthetic Checklist: , timeout performed,  Correct Patient, Correct Site, Correct Laterality,  Correct Procedure, Correct Position, site marked,  Risks and benefits discussed,  Surgical consent,  Pre-op evaluation,  At surgeon's request and post-op pain management ? ?Laterality: Right ? ?Prep: chloraprep     ?  ?Needles:  ?Injection technique: Single-shot ? ?Needle Type: Echogenic Stimulator Needle   ? ? ?Needle Length: 9cm  ?Needle Gauge: 21  ? ? ? ?Additional Needles: ? ? ?Procedures:,,,, ultrasound used (permanent image in chart),,    ?Narrative:  ?Start time: 02/18/2022 8:30 AM ?End time: 02/18/2022 8:35 AM ?Injection made incrementally with aspirations every 5 mL. ? ?Performed by: Personally  ?Anesthesiologist: Shelton Silvas, MD ? ?Additional Notes: ?Patient tolerated the procedure well. Local anesthetic introduced in an incremental fashion under minimal resistance after negative aspirations. No paresthesias were elicited. After completion of the procedure, no acute issues were identified and patient continued to be monitored by RN.  ? ? ? ? ? ?

## 2022-02-18 NOTE — Interval H&P Note (Signed)
History and Physical Interval Note: ? ?02/18/2022 ?7:09 AM ? ?[name]  has presented today for surgery, with the diagnosis of Right Posterior Tibial Tendon Insufficiency.  The various methods of treatment have been discussed with the patient and family. After consideration of risks, benefits and other options for treatment, the patient has consented to  Procedure(s): ?RIGHT TALONAVICULAR AND SUBTALAR FUSION (Right) as a surgical intervention.  The patient's history has been reviewed, patient examined, no change in status, stable for surgery.  I have reviewed the patient's chart and labs.  Questions were answered to the patient's satisfaction.   ? ? ?Kristine [name] ? ? ?

## 2022-02-18 NOTE — H&P (Signed)
Kristine [name] is an 88 y.o. female.   ?Chief Complaint: right foot pain and deformity ?HPI: Patient is an 88 year old woman who presents in follow-up for posterior tibial tendon insufficiency.  She is currently wearing slippers.  She states the posterior tibial tendon brace was not helpful.  She currently uses a rolling walker she states she cannot stand to bear weight on her foot.  Patient states she would like to consider surgery. ? ?Past Medical History:  ?Diagnosis Date  ? Arthritis   ? Asthma   ? Bronchitis   ? Cellulitis   ? left leg  ? GERD (gastroesophageal reflux disease)   ? Hiatal hernia   ? Hypertension   ? On home oxygen therapy 2 liters  ? PRN and sleeps with oxygen 2L  ? Oxygen decrease 12/19/2008  ? during colonoscopy and elbow surgery 4 yrs ago.   ? Pneumonia   ? hx of  ? Shortness of breath   ? with exertion  ? Uses roller walker   ? ? ?Past Surgical History:  ?Procedure Laterality Date  ? BACK SURGERY    ? CARPAL TUNNEL RELEASE Bilateral 80's  ? COLONOSCOPY    ? FOOT SURGERY Right 2022  ? removed bunion  ? FRACTURE SURGERY Right   ? elbow  ? HIP ARTHROPLASTY Right   ? JOINT REPLACEMENT  right knee  ? OVARIAN CYST REMOVAL  50's  ? POSTERIOR LUMBAR FUSION 4 LEVEL N/A 06/24/2015  ? Procedure: Posterior Lateral Fusion - T9 -L2 with Iliac Crest Bone Graft;  Surgeon: Donalee Citrin, MD;  Location: [facility];  Service: Neurosurgery;  Laterality: N/A;  Posterior Lateral Fusion - T9 -L2 with Iliac Crest Bone Graft  ? ROTATOR CUFF REPAIR Right   ? VEIN SURGERY Left 1970s  ? leg   ? ? ?History reviewed. No pertinent family history. ?Social History:  reports that she quit smoking about 53 years ago. Her smoking use included cigarettes. She has never used smokeless tobacco. She reports that she does not drink alcohol and does not use drugs. ? ?Allergies:  ?Allergies  ?Allergen Reactions  ? Iron Nausea And Vomiting  ?  Other reaction(s): GI Upset (intolerance) ?GI Upset ?GI Upset ?  ? Advair Diskus  [Fluticasone-Salmeterol] Other (See Comments)  ?  HR and BP go up  ? Clavulanic Acid   ? Relafen [Nabumetone] Nausea Only  ? Amoxicillin [Amoxicillin] Rash  ? ? ?No medications prior to admission.  ? ? ?No results found for this or any previous visit (from the past 48 hour(s)). ?No results found. ? ?Review of Systems  ?All other systems reviewed and are negative. ? ?Height 5\' 5"  (1.651 m), weight 105.2 kg. ?Physical Exam  ?Patient is alert, oriented, no adenopathy, well-dressed, normal affect, normal respiratory effort. ?Examination patient has a good dorsalis pedis pulse she has pronation and valgus of the forefoot she has pes planus there is no subtalar motion she has pain to palpation of the sinus Tarsi with lateral impingement.  She is short of breath seated.  She has venous stasis changes in both legs worse on the left than the right but no ulcers.  Patient's left calf measures 37 cm in circumference. ?Assessment/Plan ?1. Posterior tibial tendon dysfunction (PTTD) of right lower extremity   ?2. Venous stasis of both lower extremities   ?  ?  ?Plan: We will plan for a subtalar and talonavicular fusion.  Risks and benefits were discussed including risk of the bone not healing risk of  the skin not healing potential for additional surgery.  Patient states she understands wishes to proceed at this time.   ? ?Nadara Mustard, MD ?02/18/2022, 6:57 AM ? ? ? ?

## 2022-02-18 NOTE — Op Note (Signed)
02/18/2022 ? ?10:54 AM ? ?PATIENT:  Kristine [name]   ? ?PRE-OPERATIVE DIAGNOSIS:  Right Posterior Tibial Tendon Insufficiency ? ?POST-OPERATIVE DIAGNOSIS:  Same ? ?PROCEDURE:  RIGHT TALONAVICULAR AND SUBTALAR FUSION ?C arm fluoroscopy to verify reduction. ? ?SURGEON:  Nadara Mustard, MD ? ?PHYSICIAN ASSISTANT:None ?ANESTHESIA:   General ? ?PREOPERATIVE INDICATIONS:  [name] is a  89 y.o. female with a diagnosis of Right Posterior Tibial Tendon Insufficiency who failed conservative measures and elected for surgical management.   ? ?The risks benefits and alternatives were discussed with the patient preoperatively including but not limited to the risks of infection, bleeding, nerve injury, cardiopulmonary complications, the need for revision surgery, among others, and the patient was willing to proceed. ? ?OPERATIVE IMPLANTS: 2 staples and one headless cannulated screw. ?34 g of Kerecis micro powder and 5 cc of stay graft ? ?@ENCIMAGES @ ? ?OPERATIVE FINDINGS: C-arm fluoroscopy verified reduction in both AP and lateral planes. ? ?OPERATIVE PROCEDURE: Patient brought the operating room.  After adequate levels anesthesia were obtained patient's right lower extremity was prepped using DuraPrep draped into a sterile field a timeout was called.  Attention was first focused to the sinus Tarsi an oblique incision was made over the sinus Tarsi this was carried down to the subtalar joint.  Retractors were placed to protect the dorsal neurovascular bundles as well as the peroneal tendons.  A 4 mm bur was used to debride the posterior facet subtalar joint.  This was further debrided with a curette.  Attention was then focused on the talonavicular joint.  A incision was made dorsally between the anterior tibial tendon and EHL tendon.  This was carried down to the talonavicular joint.  Retractors were placed and a 4 mm bur was used to debride the cartilage from the talonavicular joint.  A curette was used to further  debride the joint.  The joint was then packed with the [facility] and stay graft reduced and stabilized with 2 staples.  Attention was then focused on the subtalar joint.  This was then packed with the remainder of the stay graft and Kerecis reduced a guidewire was placed from the calcaneus to the talus C arthroscopy verified alignment and a 70 mm screw was inserted see arthroscopy verified reduction.  The incisions were closed with 2-0 nylon sterile dressing was applied patient was taken the PACU in stable condition ? ? ?DISCHARGE PLANNING: ? ?Antibiotic duration: 24 hours IV antibiotics ? ?Weightbearing: Touchdown weightbearing on the right ? ?Pain medication: Opioid pathway ? ?Dressing care/ Wound VAC: Dry dressing ? ?Ambulatory devices: Walker or crutches ? ?Discharge to: Home. ? ?Follow-up: In the office 1 week post operative. ? ? ? ? ? ? ?  ?

## 2022-02-19 DIAGNOSIS — M67873 Other specified disorders of tendon, right ankle and foot: Secondary | ICD-10-CM | POA: Diagnosis not present

## 2022-02-19 MED ORDER — PREDNISONE 10 MG PO TABS
10.0000 mg | ORAL_TABLET | Freq: Once | ORAL | Status: AC
  Administered 2022-02-19: 10 mg via ORAL
  Filled 2022-02-19: qty 1

## 2022-02-19 MED ORDER — HYDROCODONE-ACETAMINOPHEN 5-325 MG PO TABS: 1.0000 | ORAL_TABLET | ORAL | 0 refills | Status: DC | PRN

## 2022-02-19 NOTE — Evaluation (Addendum)
Physical Therapy Evaluation ?Patient Details ?Name: Kristine [name] ?MRN: [id number] ?DOB: 08-31-1931 ?Today's Date: 02/19/2022 ? ?History of Present Illness ? Pt is an 88 y.o. 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 PMH: HTN, GERD, asthma, OA, h/o back sx, noturnal O2 at home ?  ?Clinical Impression ? Pt admitted with above diagnosis. PTA pt lived at home with son and granddaughter in one-level house with ramp to enter. Mod I mobility and basic ADLs using rollator. Pt currently with functional limitations due to the deficits listed below (see PT Problem List). On eval, pt required mod assist bed mobility, max assist sit to stand in stedy, and dependent stedy transfer bed to recliner. Pt unable to maintain TDWB during stance. Pt will benefit from skilled PT to increase their independence and safety with mobility to allow discharge to the venue listed below.  Pt reports family will be able to assist at home. Pt plans to sleep in her lift chair. She has all needed DME, including wheelchair. Pt would benefit from staying at hospital for further mobility training prior to d/c home.  ?   ?   ? ?Recommendations for follow up therapy are one component of a multi-disciplinary discharge planning process, led by the attending physician.  Recommendations may be updated based on patient status, additional functional criteria and insurance authorization. ? ?Follow Up Recommendations Home health PT ? ?  ?Assistance Recommended at Discharge Frequent or constant Supervision/Assistance  ?Patient can return home with the following ? A lot of help with bathing/dressing/bathroom;Assistance with cooking/housework;Assist for transportation;A lot of help with walking and/or transfers;Help with stairs or ramp for entrance ? ?  ?Equipment Recommendations None recommended by PT  ?Recommendations for Other Services ?    ?  ?Functional Status Assessment Patient has had a recent decline in  their functional status and demonstrates the ability to make significant improvements in function in a reasonable and predictable amount of time.  ? ?  ?Precautions / Restrictions Precautions ?Precautions: Fall ?Required Braces or Orthoses: Other Brace ?Other Brace: CAM boot RLE ?Restrictions ?Weight Bearing Restrictions: Yes ?RLE Weight Bearing: Touchdown weight bearing ?Other Position/Activity Restrictions: in CAM boot  ? ?  ? ?Mobility ? Bed Mobility ?Overal bed mobility: Needs Assistance ?Bed Mobility: Supine to Sit ?  ?  ?Supine to sit: Mod assist, HOB elevated ?  ?  ?General bed mobility comments: assist with RLE and trunk, +rail, increased time, cues for sequencing, use of bed pad to scoot to EOB ?  ? ?Transfers ?Overall transfer level: Needs assistance ?  ?Transfers: Sit to/from Stand, Bed to chair/wheelchair/BSC ?Sit to Stand: Max assist ?  ?  ?  ?  ?  ?General transfer comment: Attempted sit to stand with RW without success. Pt only able to clear bottom a couple inches from bed. Max assist sit to stand in stedy and dependent stedy transfer bed to recliner. Pt unable to maintain TDWB in stance. ?Transfer via Lift Equipment: Stedy ? ?Ambulation/Gait ?  ?  ?  ?  ?  ?  ?  ?  ? ?Stairs ?  ?  ?  ?  ?  ? ?Wheelchair Mobility ?  ? ?Modified Rankin (Stroke Patients Only) ?  ? ?  ? ?Balance Overall balance assessment: Needs assistance ?Sitting-balance support: Bilateral upper extremity supported, Feet supported ?Sitting balance-Leahy Scale: Fair ?Sitting balance - Comments: heavy posterior lean initially, cues and increased time to stabilize sitting balance EOB ?Postural control: Posterior lean ?Standing balance  support: Reliant on assistive device for balance, Bilateral upper extremity supported, During functional activity ?Standing balance-Leahy Scale: Poor ?  ?  ?  ?  ?  ?  ?  ?  ?  ?  ?  ?  ?   ? ? ? ?Pertinent Vitals/Pain Pain Assessment ?Pain Assessment: Faces ?Faces Pain Scale: Hurts little more ?Pain  Location: R foot during mobility ?Pain Descriptors / Indicators: Grimacing, Guarding ?Pain Intervention(s): Limited activity within patient's tolerance, Repositioned, Monitored during session  ? ? ?Home Living Family/patient expects to be discharged to:: Private residence ?Living Arrangements: Children;Other relatives (son and granddaughter) ?Available Help at Discharge: Family;Available 24 hours/day ?Type of Home: House ?Home Access: Ramped entrance ?  ?  ?  ?Home Layout: One level ?Home Equipment: Gilmer Mor - single point;Shower Counsellor (2 wheels);Rollator (4 wheels);BSC/3in1;Wheelchair - manual ?   ?  ?Prior Function Prior Level of Function : Needs assist ?  ?  ?  ?Physical Assist : ADLs (physical) ?  ?ADLs (physical): IADLs ?Mobility Comments: rollator for amb, Mod I mobility ?ADLs Comments: mod I basic ADLs, family assists with iADLs ?  ? ? ?Hand Dominance  ? Dominant Hand: Right ? ?  ?Extremity/Trunk Assessment  ? Upper Extremity Assessment ?Upper Extremity Assessment: Overall WFL for tasks assessed ?  ? ?Lower Extremity Assessment ?Lower Extremity Assessment: RLE deficits/detail ?RLE Deficits / Details: s/p ankle sx, dressing in place ?  ? ?Cervical / Trunk Assessment ?Cervical / Trunk Assessment: Kyphotic  ?Communication  ? Communication: No difficulties  ?Cognition Arousal/Alertness: Awake/alert ?Behavior During Therapy: [facility] for tasks assessed/performed ?Overall Cognitive Status: Within Functional Limits for tasks assessed ?  ?  ?  ?  ?  ?  ?  ?  ?  ?  ?  ?  ?  ?  ?  ?  ?  ?  ?  ? ?  ?General Comments General comments (skin integrity, edema, etc.): 2.5L via [place] with SpO2 in 90s. Wheezing noted. ? ?  ?Exercises    ? ?Assessment/Plan  ?  ?PT Assessment Patient needs continued PT services  ?PT Problem List Decreased mobility;Decreased strength;Decreased knowledge of precautions;Decreased activity tolerance;Cardiopulmonary status limiting activity;Decreased balance;Pain ? ?   ?  ?PT Treatment  Interventions Therapeutic activities;Therapeutic exercise;Patient/family education;Balance training;Functional mobility training   ? ?PT Goals (Current goals can be found in the Care Plan section)  ?Acute Rehab PT Goals ?Patient Stated Goal: home ?PT Goal Formulation: With patient ?Time For Goal Achievement: 02/26/22 ?Potential to Achieve Goals: Good ? ?  ?Frequency Min 5X/week ?  ? ? ?Co-evaluation   ?  ?  ?  ?  ? ? ?  ?AM-PAC PT "6 Clicks" Mobility  ?Outcome Measure Help needed turning from your back to your side while in a flat bed without using bedrails?: A Little ?Help needed moving from lying on your back to sitting on the side of a flat bed without using bedrails?: A Lot ?Help needed moving to and from a bed to a chair (including a wheelchair)?: Total ?Help needed standing up from a chair using your arms (e.g., wheelchair or bedside chair)?: Total ?Help needed to walk in hospital room?: Total ?Help needed climbing 3-5 steps with a railing? : Total ?6 Click Score: 9 ? ?  ?End of Session Equipment Utilized During Treatment: Gait belt;Oxygen;Other (comment) (CAM boot) ?Activity Tolerance: Patient tolerated treatment well ?Patient left: in chair;with call bell/phone within reach ?Nurse Communication: Mobility status;Need for lift equipment ?PT Visit Diagnosis: Other abnormalities of gait  and mobility (R26.89);Pain ?Pain - Right/Left: Right ?Pain - part of body: Ankle and joints of foot ?  ? ?Time: 1607-3710 ?PT Time Calculation (min) (ACUTE ONLY): 35 min ? ? ?Charges:   PT Evaluation ?$PT Eval Moderate Complexity: 1 Mod ?PT Treatments ?$Therapeutic Activity: 8-22 mins ?  ?   ? ? ?Aida Raider, PT  ?Office # [phone] ?Pager [phone] ? ? ?Ilda Foil ?02/19/2022, 11:24 AM ? ?

## 2022-02-19 NOTE — Progress Notes (Signed)
Patient ID: Kristine [name], female   DOB: [birth year], 88 y.o.   MRN: [id number] ?Patient is status post right subtalar and talonavicular fusion.  She has no complaints this morning plan for discharge to home today.  Dressing clean and dry.  I will order a postoperative shoe she may be touchdown weightbearing on the right.  Does not look like the fracture boot will fit her dressing. ?

## 2022-02-19 NOTE — Care Management Obs Status (Signed)
MEDICARE OBSERVATION STATUS NOTIFICATION ? ? ?Patient Details  ?Name: Kristine [name] ?MRN: [id number] ?Date of Birth: 09-18-1931 ? ? ?Medicare Observation Status Notification Given:  Yes ? ? ? ?Kallie Locks, RN ?02/19/2022, 3:01 PM ?

## 2022-02-19 NOTE — Discharge Summary (Signed)
?Discharge Diagnoses:  ?Principal Problem: ?  Posterior tibial tendon dysfunction (PTTD) of right lower extremity ? ? ?Surgeries: Procedure(s): ?RIGHT TALONAVICULAR AND SUBTALAR FUSION on 02/18/2022  ?  ?Consultants:  ? ?Discharged Condition: Improved ? ?Hospital Course: Posterior tibial tendon insufficiency on the right Kristine [name] is an 88 y.o. female who was admitted 02/18/2022 with a chief complaint of posterior tibial tendon insufficiency on the right, with a final diagnosis of Right Posterior Tibial Tendon Insufficiency.  Patient was brought to the operating room on 02/18/2022 and underwent Procedure(s): ?RIGHT TALONAVICULAR AND SUBTALAR FUSION.   ? ?Patient was given perioperative antibiotics:  ?Anti-infectives (From admission, onward)  ? ? Start     Dose/Rate Route Frequency Ordered Stop  ? 02/18/22 1600  ceFAZolin (ANCEF) IVPB 1 g/50 mL premix       ? 1 g ?100 mL/hr over 30 Minutes Intravenous Every 6 hours 02/18/22 1433 02/19/22 0528  ? 02/18/22 0730  ceFAZolin (ANCEF) IVPB 2g/100 mL premix       ? 2 g ?200 mL/hr over 30 Minutes Intravenous On call to O.R. 02/18/22 1655 02/18/22 0954  ? ?  ?. ? ?Patient was given sequential compression devices, early ambulation, and aspirin for DVT prophylaxis. ? ?Recent vital signs: Patient Vitals for the past 24 hrs: ? BP Temp Temp src Pulse Resp SpO2  ?02/19/22 0744 102/60 99.3 ?F (37.4 ?C) Oral 76 17 99 %  ?02/19/22 0437 (!) 88/61 -- -- 100 -- 99 %  ?02/18/22 2053 139/76 98 ?F (36.7 ?C) Oral 70 18 100 %  ?02/18/22 1739 136/76 98.1 ?F (36.7 ?C) Oral 66 17 94 %  ?02/18/22 1700 -- (!) 97 ?F (36.1 ?C) -- -- 20 --  ?02/18/22 1622 123/81 -- -- 63 19 99 %  ?02/18/22 1520 107/77 -- -- 68 20 98 %  ?02/18/22 1420 111/69 -- -- (!) 57 18 98 %  ?02/18/22 1320 128/70 -- -- 68 16 97 %  ?02/18/22 1250 (!) 112/56 -- -- 63 18 100 %  ?02/18/22 1220 109/62 97.6 ?F (36.4 ?C) -- (!) 59 16 99 %  ?02/18/22 1205 (!) 114/47 -- -- (!) 59 15 100 %  ?02/18/22 1150 (!) 103/55 -- -- (!) 58 16 100 %   ?02/18/22 1136 (!) 104/59 -- -- (!) 58 16 96 %  ?02/18/22 1120 (!) 107/58 -- -- 60 14 98 %  ?02/18/22 1105 118/69 -- -- 60 16 100 %  ?02/18/22 1050 123/86 97.6 ?F (36.4 ?C) -- -- 20 100 %  ?. ? ?Recent laboratory studies: No results found. ? ?Discharge Medications:   ?Allergies as of 02/19/2022   ? ?   Reactions  ? Iron Nausea And Vomiting  ? Other reaction(s): GI Upset (intolerance) ?GI Upset ?GI Upset  ? Advair Diskus [fluticasone-salmeterol] Other (See Comments)  ? HR and BP go up  ? Clavulanic Acid   ? Relafen [nabumetone] Nausea Only  ? Amoxicillin [amoxicillin] Rash  ? ?  ? ?  ?Medication List  ?  ? ?TAKE these medications   ? ?acetaminophen 500 MG tablet ?Commonly known as: TYLENOL ?Take 1,000 mg by mouth every 4 (four) hours as needed (Arthritis pain). ?  ?albuterol 108 (90 Base) MCG/ACT inhaler ?Commonly known as: VENTOLIN HFA ?Inhale 2 puffs into the lungs every 6 (six) hours as needed for wheezing or shortness of breath. ?  ?Calcium Carb-Cholecalciferol [phone] MG-UNIT Caps ?Take 1 tablet by mouth 2 (two) times daily. ?  ?cetirizine 10 MG tablet ?Commonly known as: ZYRTEC ?Take  10 mg by mouth daily. ?  ?diclofenac Sodium 1 % Gel ?Commonly known as: VOLTAREN ?Apply 1 application topically daily as needed for pain. ?  ?DULoxetine 60 MG capsule ?Commonly known as: CYMBALTA ?Take 60 mg by mouth daily. ?  ?fluticasone 50 MCG/ACT nasal spray ?Commonly known as: FLONASE ?Place 1 spray into both nostrils daily as needed for congestion. ?  ?furosemide 20 MG tablet ?Commonly known as: LASIX ?Take 20 mg by mouth every other day. ?  ?gabapentin 300 MG capsule ?Commonly known as: NEURONTIN ?Take 600-900 mg by mouth See admin instructions. Take 600 mg in the morning and 900 mg at bedtime ?  ?GLUCOSAMINE COMPLEX PO ?Take 2,000 mg by mouth 2 (two) times daily. ?  ?HYDROcodone-acetaminophen 5-325 MG tablet ?Commonly known as: NORCO/VICODIN ?Take 1 tablet by mouth every 4 (four) hours as needed. ?What changed:  ?when to  take this ?reasons to take this ?  ?ipratropium-albuterol 0.5-2.5 (3) MG/3ML Soln ?Commonly known as: DUONEB ?Take 3 mLs by nebulization daily as needed (Congestion). ?  ?losartan 25 MG tablet ?Commonly known as: COZAAR ?Take 25 mg by mouth daily. ?  ?meclizine 25 MG tablet ?Commonly known as: ANTIVERT ?Take 25 mg by mouth daily as needed for dizziness. ?  ?montelukast 10 MG tablet ?Commonly known as: SINGULAIR ?Take 10 mg by mouth at bedtime. ?  ?OMEGA 3-6-9 FATTY ACIDS PO ?Take 1 capsule by mouth 2 (two) times daily. ?  ?omeprazole 40 MG capsule ?Commonly known as: PRILOSEC ?Take 40 mg by mouth every morning. ?  ?OXYGEN ?Inhale 2 L into the lungs daily as needed (Shortness of breath). ?  ?polyethylene glycol powder 17 GM/SCOOP powder ?Commonly known as: GLYCOLAX/MIRALAX ?Take 17 g by mouth daily as needed for moderate constipation or mild constipation. ?  ?simvastatin 40 MG tablet ?Commonly known as: ZOCOR ?Take 40 mg by mouth at bedtime. ?  ?Turmeric 500 MG Caps ?Take 500 mg by mouth 2 (two) times daily. ?  ? ?  ? ?  ?  ? ? ?  ?Discharge Care Instructions  ?(From admission, onward)  ?  ? ? ?  ? ?  Start     Ordered  ? 02/19/22 0000  Touch down weight bearing       ?Question Answer Comment  ?Laterality right   ?Extremity Lower   ?  ? 02/19/22 0907  ? ?  ?  ? ?  ? ? ?Diagnostic Studies: No results found. ? ?Patient benefited maximally from their hospital stay and there were no complications.   ? ? ?Disposition: Discharge disposition: 01-Home or Self Care ? ? ? ? ? ?Discharge Instructions   ? ? Call MD / Call 911   Complete by: As directed ?  ? If you experience chest pain or shortness of breath, CALL 911 and be transported to the hospital emergency room.  If you develope a fever above 101 F, pus (white drainage) or increased drainage or redness at the wound, or calf pain, call your surgeon's office.  ? Constipation Prevention   Complete by: As directed ?  ? Drink plenty of fluids.  Prune juice may be helpful.  You  may use a stool softener, such as Colace (over the counter) 100 mg twice a day.  Use MiraLax (over the counter) for constipation as needed.  ? Diet - low sodium heart healthy   Complete by: As directed ?  ? Increase activity slowly as tolerated   Complete by: As directed ?  ? Post-op  shoe   Complete by: As directed ?  ? Post-operative opioid taper instructions:   Complete by: As directed ?  ? POST-OPERATIVE OPIOID TAPER INSTRUCTIONS: ?It is important to wean off of your opioid medication as soon as possible. If you do not need pain medication after your surgery it is ok to stop day one. ?Opioids include: ?Codeine, Hydrocodone(Norco, Vicodin), Oxycodone(Percocet, oxycontin) and hydromorphone amongst others.  ?Long term and even short term use of opiods can cause: ?Increased pain response ?Dependence ?Constipation ?Depression ?Respiratory depression ?And more.  ?Withdrawal symptoms can include ?Flu like symptoms ?Nausea, vomiting ?And more ?Techniques to manage these symptoms ?Hydrate well ?Eat regular healthy meals ?Stay active ?Use relaxation techniques(deep breathing, meditating, yoga) ?Do Not substitute Alcohol to help with tapering ?If you have been on opioids for less than two weeks and do not have pain than it is ok to stop all together.  ?Plan to wean off of opioids ?This plan should start within one week post op of your joint replacement. ?Maintain the same interval or time between taking each dose and first decrease the dose.  ?Cut the total daily intake of opioids by one tablet each day ?Next start to increase the time between doses. ?The last dose that should be eliminated is the evening dose.  ? ?  ? Touch down weight bearing   Complete by: As directed ?  ? Laterality: right  ? Extremity: Lower  ? ?  ? ? Follow-up Information   ? ? Nadara Mustard, MD Follow up in 1 week(s).   ?Specialty: Orthopedic Surgery ?Contact information: ?[address] ?[place] [address] ?[phone] ? ? ?  ?  ? ?  ?  ? ?   ? ? ? ?Signed: ?Nadara Mustard ?02/19/2022, 9:07 AM ? ?

## 2022-02-19 NOTE — TOC Transition Note (Signed)
Transition of Care (TOC) - CM/SW Discharge Note ? ? ?Patient Details  ?Name: Erinne Gillentine ?MRN: [id number] ?Date of Birth: 08/13/1931 ? ?Transition of Care (TOC) CM/SW Contact:  ?Kallie Locks, RN ?Phone Number: [phone] ?02/19/2022, 11:25 AM ? ? ?Clinical Narrative:   Spoke with Mrs. Agnes to discuss TOC needs. 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 Wears home 02 at night. Has DME at home- w/c, walker, bedside commode, and rollator. PCP is Dr. Domingo Cocking near her home in [place]. Has had [facility] in the past and would like [facility] again. Confirmed with PT that home health PT/OT recommended and there are no DME recommendations. Cory with [facility] is able to accept referral.  ? ?No further TOC needs assessed.  ? ? ? ?Final next level of care: Home w Home Health Services ?  ? ? ?Patient Goals and CMS Choice ?Patient states their goals for this hospitalization and ongoing recovery are:: return home ?  ?Choice offered to / list presented to : Patient ? ?Discharge Placement ?  ?           ?  ?  ?  ?  ? ?Discharge Plan and Services ?  ?Discharge Planning Services: CM Consult ?Post Acute Care Choice: Home Health          ?  ?  ?  ?  ?  ?HH Arranged: PT, OT ?HH Agency: [facility] ?Date HH Agency Contacted: 02/19/22 ?Time HH Agency Contacted: 1124 ?Representative spoke with at [facility] Agency: Kandee Keen ? ?Social Determinants of Health (SDOH) Interventions ?  ? ? ?Readmission Risk Interventions ?No flowsheet data found. ? ? ? ? ?

## 2022-02-19 NOTE — Progress Notes (Signed)
Orthopedic Tech Progress Note ?Patient Details:  ?Kristine [name] ?Mar 25, 1931 ?[id number] ? ?Ortho Devices ?Type of Ortho Device: CAM walker ?Ortho Device/Splint Location: delivered to room ?Ortho Device/Splint Interventions: Ordered ?  ?  ? ?Trinna Post ?02/19/2022, 6:27 AM ? ?

## 2022-02-20 DIAGNOSIS — M67873 Other specified disorders of tendon, right ankle and foot: Secondary | ICD-10-CM | POA: Diagnosis not present

## 2022-02-20 MED ORDER — ACETAMINOPHEN 500 MG PO TABS
500.0000 mg | ORAL_TABLET | Freq: Four times a day (QID) | ORAL | Status: DC
  Administered 2022-02-20 – 2022-02-21 (×5): 500 mg via ORAL
  Filled 2022-02-20 (×5): qty 1

## 2022-02-20 MED ORDER — OXYCODONE HCL 5 MG PO TABS: 5.0000 mg | ORAL_TABLET | ORAL | Status: DC | PRN

## 2022-02-20 NOTE — Plan of Care (Signed)
°  Problem: Clinical Measurements: °Goal: Will remain free from infection °Outcome: Progressing °Goal: Respiratory complications will improve °Outcome: Progressing °  °

## 2022-02-20 NOTE — Progress Notes (Signed)
?  Subjective: ?Patient stable.  Describes burning pain.  Having a hard time holding onto things because of the significant arthritis in her hands.  ? ?Objective: ?Vital signs in last 24 hours: ?Temp:  [98 ?F (36.7 ?C)-98.9 ?F (37.2 ?C)] 98 ?F (36.7 ?C) (03/05 0810) ?Pulse Rate:  [87-104] 104 (03/05 0810) ?Resp:  [17-18] 18 (03/05 0810) ?BP: (92-114)/(61-70) 114/70 (03/05 0810) ?SpO2:  [98 %-99 %] 99 % (03/05 0810) ? ?Intake/Output from previous day: ?03/04 0701 - 03/05 0700 ?In: 450 [P.O.:450] ?Out: -  ?Intake/Output this shift: ?No intake/output data recorded. ? ?Exam: ? ?Right foot is in a fracture boot.  Toes are perfused. ? ?Labs: ?Recent Labs  ?  02/18/22 ?0806  ?HGB 11.6*  ? ?Recent Labs  ?  02/18/22 ?0806  ?WBC 6.5  ?RBC 3.66*  ?HCT 36.1  ?PLT 191  ? ?Recent Labs  ?  02/18/22 ?0806  ?NA 138  ?K 4.1  ?CL 103  ?CO2 26  ?BUN 25*  ?CREATININE 1.33*  ?GLUCOSE 112*  ?CALCIUM 9.4  ? ?No results for input(s): LABPT, INR in the last 72 hours. ? ?Assessment/Plan: ?Plan at this time is to mobilize at least to the chair as tolerated.  The patient did spend most of yesterday in the chair.  She may be having some over sedation issues.  Plan to decrease her oxycodone to about 5 mg every 6 hours.  Placement pending. ? ? ?Anderson Malta ?02/20/2022, 11:57 AM  ? ? ?  ?

## 2022-02-20 NOTE — Progress Notes (Signed)
Physical Therapy Treatment ?Patient Details ?Name: Kristine [name] ?MRN: [id number] ?DOB: 01-30-1931 ?Today's Date: 02/20/2022 ? ? ?History of Present Illness Pt is an 88 y.o. 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 PMH: HTN, GERD, asthma, OA, h/o back sx, noturnal O2 at home ? ?  ?PT Comments  ? ? Continuing work on functional mobility and activity tolerance;  Session focused on functional transfers with the plan on going home, however, today pt is having difficulty with bed mobility and sitting up; Needing Max assist today to come to sit at EOB, and once up, she required constant hands-on Max assist to maintain sitting due to persistent and heavy posterior lean; Extreme difficulty leaning forward enough to push through L foot on floor and allow for unweighing hips for scooting or standing; Assisted back to supine and helped pt onto bed pan;  ? ?Ms. Riquelme needs to be able to move better in order to get home; Updated recommendations to SNF for post-acute rehab to maximize independence and safety with mobility and ADLs -- however I do anticipate she will refuse SNF, in which case we will need to maximize [facility] services  ?Recommendations for follow up therapy are one component of a multi-disciplinary discharge planning process, led by the attending physician.  Recommendations may be updated based on patient status, additional functional criteria and insurance authorization. ? ?Follow Up Recommendations ? Skilled nursing-short term rehab (<3 hours/day) (Pt refusing SNF, but must be able to move better in order to dc home) ?  ?  ?Assistance Recommended at Discharge Frequent or constant Supervision/Assistance  ?Patient can return home with the following A lot of help with bathing/dressing/bathroom;Assistance with cooking/housework;Assist for transportation;A lot of help with walking and/or transfers;Help with stairs or ramp for entrance ?  ?Equipment Recommendations ?  None recommended by PT  ?  ?Recommendations for Other Services OT consult (ordered per protocol) ? ? ?  ?Precautions / Restrictions Precautions ?Precautions: Fall ?Required Braces or Orthoses: Other Brace ?Other Brace: CAM boot RLE ?Restrictions ?RLE Weight Bearing: Touchdown weight bearing ?Other Position/Activity Restrictions: in CAM boot  ?  ? ?Mobility ? Bed Mobility ?Overal bed mobility: Needs Assistance ?Bed Mobility: Supine to Sit, Sit to Supine ?  ?  ?Supine to sit: Max assist ?Sit to supine: +2 for physical assistance, Max assist ?  ?General bed mobility comments: assist with RLE and trunk, +rail, increased time, cues for sequencing, use of bed pad to scoot to EOB; Once EOB, pt with significant posterior lean ?  ? ?Transfers ?  ?  ?  ?  ?  ?  ?  ?  ?  ?General transfer comment: Unable to safely transfer due to significant and persistent posterior lean ?  ? ?Ambulation/Gait ?  ?  ?  ?  ?  ?  ?  ?  ? ? ?Stairs ?  ?  ?  ?  ?  ? ? ?Wheelchair Mobility ?  ? ?Modified Rankin (Stroke Patients Only) ?  ? ? ?  ?Balance   ?  ?Sitting balance-Leahy Scale: Zero ?Sitting balance - Comments: Required Mod/Max assist for the entire duration of sitting up and EOB due to posterior lean ?Postural control: Posterior lean ?  ?  ?  ?  ?  ?  ?  ?  ?  ?  ?  ?  ?  ?  ?  ? ?  ?Cognition Arousal/Alertness: Awake/alert ?Behavior During Therapy: [facility] for tasks assessed/performed ?Overall Cognitive Status: Impaired/Different  from baseline ?Area of Impairment: Safety/judgement, Problem solving, Awareness, Following commands ?  ?  ?  ?  ?  ?  ?  ?  ?  ?  ?  ?Following Commands: Follows one step commands inconsistently, Follows one step commands with increased time ?Safety/Judgement: Decreased awareness of safety, Decreased awareness of deficits ?Awareness: Intellectual ?Problem Solving: Decreased initiation, Difficulty sequencing, Requires verbal cues, Requires tactile cues ?General Comments: Discussed pt with evaluating PT; Pt was able to  acheive balance in upright sitting yesterday with the cue "lean forward like you're looking at your toes" ?  ?  ? ?  ?Exercises   ? ?  ?General Comments General comments (skin integrity, edema, etc.): Wheezing noted; Session conducted on 2 L supplemental O2; O2 sats ranged 93-95; HR 112 at end of session ?  ?  ? ?Pertinent Vitals/Pain Pain Assessment ?Pain Assessment: Faces ?Faces Pain Scale: Hurts whole lot ?Pain Location: R foot during mobility; also arthiritic hands; Lots of crying in pain with moving today ?Pain Descriptors / Indicators: Grimacing, Guarding ?Pain Intervention(s): Monitored during session, Repositioned  ? ? ?Home Living   ?  ?  ?  ?  ?  ?  ?  ?  ?  ?   ?  ?Prior Function    ?  ?  ?   ? ?PT Goals (current goals can now be found in the care plan section) Acute Rehab PT Goals ?Patient Stated Goal: Wants to go home; refusing SNF when discussed the need to consider post-acute rehab ?PT Goal Formulation: With patient ?Time For Goal Achievement: 02/26/22 ?Potential to Achieve Goals: Fair ?Progress towards PT goals: Not progressing toward goals - comment (Seemed more painful, and less able to maintain sitting balance this session than yesterday) ? ?  ?Frequency ? ? ? Min 5X/week ? ? ? ?  ?PT Plan Discharge plan needs to be updated  ? ? ?Co-evaluation   ?  ?  ?  ?  ? ?  ?AM-PAC PT "6 Clicks" Mobility   ?Outcome Measure ? Help needed turning from your back to your side while in a flat bed without using bedrails?: A Little ?Help needed moving from lying on your back to sitting on the side of a flat bed without using bedrails?: A Lot ?Help needed moving to and from a bed to a chair (including a wheelchair)?: Total ?Help needed standing up from a chair using your arms (e.g., wheelchair or bedside chair)?: Total ?Help needed to walk in hospital room?: Total ?Help needed climbing 3-5 steps with a railing? : Total ?6 Click Score: 9 ? ?  ?End of Session Equipment Utilized During Treatment: Oxygen (CAM  boot) ?Activity Tolerance: Patient limited by pain ?Patient left: in bed;with call bell/phone within reach;with bed alarm set (on bedpan) ?Nurse Communication: Mobility status;Need for lift equipment ?PT Visit Diagnosis: Other abnormalities of gait and mobility (R26.89);Pain ?Pain - Right/Left: Right ?Pain - part of body: Ankle and joints of foot ?  ? ? ?Time: 1203-1227 ?PT Time Calculation (min) (ACUTE ONLY): 24 min ? ?Charges:  $Therapeutic Activity: 23-37 mins          ?          ? ?Van Clines, PT  ?Acute Rehabilitation Services ?Pager [phone] ?Office [phone] ? ? ? ?Levi Aland ?02/20/2022, 4:40 PM ? ?

## 2022-02-20 NOTE — Plan of Care (Signed)
  Problem: Nutrition: Goal: Adequate nutrition will be maintained Outcome: Progressing   

## 2022-02-21 DIAGNOSIS — M67873 Other specified disorders of tendon, right ankle and foot: Secondary | ICD-10-CM | POA: Diagnosis not present

## 2022-02-21 NOTE — Progress Notes (Signed)
Physical Therapy Treatment ?Patient Details ?Name: Kristine [name] ?MRN: [id number] ?DOB: Aug 11, 1931 ?Today's Date: 02/21/2022 ? ? ?History of Present Illness Pt is an 88 y.o. 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 PMH: HTN, GERD, asthma, OA, h/o back sx, noturnal O2 at home ? ?  ?PT Comments  ? ? Focus of session today was repeated trasnfers and ambulation tolerance. The patient tolerated as expected.  Pt. Shows overall improvement with pain management and functional ambulation. Overall strength, cognition, sitting and standing balance, and functional capacity are still limiting function. Pt. Would benefit from skilled PT to continue to address her ability to transfer, walk, and improve static and dynamic balance. Pt. Refuses follow up with SNF after extensive education, SPT now recommending follow up from Home health with maximum support from family. The patient still requires max-mod A for transfer ability and presents with significant decreased gait endurance; leading her to need supervision and assist at all times for safety for mobility and ADLS/iADLs. Pt to follow acutely as appropriate.  ?   ?Recommendations for follow up therapy are one component of a multi-disciplinary discharge planning process, led by the attending physician.  Recommendations may be updated based on patient status, additional functional criteria and insurance authorization. ? ?Follow Up Recommendations ? Home health PT (Pt. refusing SNF despite reccomendations so they will need to maximize home health services) ?  ?  ?Assistance Recommended at Discharge Frequent or constant Supervision/Assistance  ?Patient can return home with the following A lot of help with bathing/dressing/bathroom;Assistance with cooking/housework;Assist for transportation;A lot of help with walking and/or transfers;Help with stairs or ramp for entrance ?  ?Equipment Recommendations ? None recommended by PT  ?   ?Recommendations for Other Services   ? ? ?  ?Precautions / Restrictions Precautions ?Precautions: Fall ?Required Braces or Orthoses: Other Brace ?Other Brace: CAM boot RLE ?Restrictions ?Weight Bearing Restrictions: Yes ?RLE Weight Bearing: Weight bearing as tolerated ?Other Position/Activity Restrictions: WBAT in boot as of 3/6 per Dr. Lajoyce Corners  ?  ? ?Mobility ? Bed Mobility ?Overal bed mobility: Needs Assistance ?  ?  ?  ?  ?  ?  ?General bed mobility comments: Pt. in chair upon PT arrival ?  ? ?Transfers ?Overall transfer level: Needs assistance ?Equipment used: Rolling walker (2 wheels) ?Transfers: Sit to/from Stand, Bed to chair/wheelchair/BSC ?Sit to Stand: Mod assist, +2 physical assistance ?  ?Step pivot transfers: Min assist, +2 physical assistance, From elevated surface ?  ?  ?  ?General transfer comment: Short steps, pt, initially requring Max A x2 for STS with significant sequencing cues, eventaully only needing Mod A x1 for final STS during repeated trasnfer practice. STS x3. O2 87% after ambulation and requires 2L O2 via nasal canula and seated rest to allow resaturation ?  ? ?Ambulation/Gait ?Ambulation/Gait assistance: Mod assist ?Gait Distance (Feet): 15 Feet ?Assistive device: Rolling walker (2 wheels) ?Gait Pattern/deviations: Step-to pattern, Decreased stance time - right, Decreased step length - right, Decreased stride length, Trunk flexed, Antalgic, Knee flexed in stance - left, Knee flexed in stance - right, Shuffle ?Gait velocity: decreased ?  ?  ?General Gait Details: Short shuffling steps, significant WB on walker, complains of arm fatigue after ambulation ? ? ?Stairs ?  ?  ?  ?  ?  ? ? ?Wheelchair Mobility ?  ? ?Modified Rankin (Stroke Patients Only) ?  ? ? ?  ?Balance Overall balance assessment: Needs assistance ?Sitting-balance support: Bilateral upper extremity supported, Feet supported ?  Sitting balance-Leahy Scale: Fair ?Sitting balance - Comments: seated in chair and EOB pt. able to  support herself with UE support ?  ?Standing balance support: Reliant on assistive device for balance, Bilateral upper extremity supported, During functional activity ?Standing balance-Leahy Scale: Poor ?  ?  ?  ?  ?  ?  ?  ?  ?  ?  ?  ?  ?  ? ?  ?Cognition Arousal/Alertness: Awake/alert ?Behavior During Therapy: [facility] for tasks assessed/performed ?Overall Cognitive Status: Impaired/Different from baseline ?Area of Impairment: Safety/judgement, Problem solving, Awareness, Following commands ?  ?  ?  ?  ?  ?  ?  ?  ?  ?  ?  ?Following Commands: Follows one step commands inconsistently, Follows one step commands with increased time, Follows multi-step commands inconsistently ?Safety/Judgement: Decreased awareness of safety, Decreased awareness of deficits ?Awareness: Intellectual ?Problem Solving: Decreased initiation, Difficulty sequencing, Requires verbal cues, Requires tactile cues ?General Comments: Pt. requires multiple cues and reminders of sequecning how to get out of chair ?  ?  ? ?  ?Exercises   ? ?  ?General Comments General comments (skin integrity, edema, etc.): 2L nasal canula after ambulation and during repeated trasnfer. Significant education on proper breathing techniques and education about diaphragmatic breathing ?  ?  ? ?Pertinent Vitals/Pain Pain Assessment ?Pain Assessment: Faces ?Faces Pain Scale: Hurts little more ?Pain Location: R foot with movement ?Pain Descriptors / Indicators: Grimacing, Guarding ?Pain Intervention(s): Limited activity within patient's tolerance, Monitored during session  ? ? ?Home Living Family/patient expects to be discharged to:: Private residence ?Living Arrangements: Children;Other relatives (lives with adult son (age ~44) and granddaughter (age ~30)) ?Available Help at Discharge: Family;Available 24 hours/day ?Type of Home: House ?Home Access: Ramped entrance ?  ?  ?  ?Home Layout: One level ?Home Equipment: Gilmer Mor - single point;Shower Counsellor (2  wheels);Rollator (4 wheels);BSC/3in1;Wheelchair - manual ?Additional Comments: states son and granddaughter will be able to assist at d/c with ADLs and transfers  ?  ?Prior Function    ?  ?  ?   ? ?PT Goals (current goals can now be found in the care plan section) Acute Rehab PT Goals ?Patient Stated Goal: Wants to go home; refusing SNF when discussed the need to consider post-acute rehab ?PT Goal Formulation: With patient ?Time For Goal Achievement: 02/26/22 ?Potential to Achieve Goals: Fair ?Progress towards PT goals: Progressing toward goals ? ?  ?Frequency ? ? ? Min 3X/week ? ? ? ?  ?PT Plan Discharge plan needs to be updated  ? ? ?Co-evaluation   ?  ?  ?  ?  ? ?  ?AM-PAC PT "6 Clicks" Mobility   ?Outcome Measure ? Help needed turning from your back to your side while in a flat bed without using bedrails?: A Little ?Help needed moving from lying on your back to sitting on the side of a flat bed without using bedrails?: A Lot ?Help needed moving to and from a bed to a chair (including a wheelchair)?: A Lot ?Help needed standing up from a chair using your arms (e.g., wheelchair or bedside chair)?: A Lot ?Help needed to walk in hospital room?: A Lot ?Help needed climbing 3-5 steps with a railing? : Total ?6 Click Score: 12 ? ?  ?End of Session Equipment Utilized During Treatment: Oxygen;Gait belt ?Activity Tolerance: Patient tolerated treatment well ?Patient left: in chair;with call bell/phone within reach;with chair alarm set ?Nurse Communication: Mobility status ?PT Visit Diagnosis: Other abnormalities of gait  and mobility (R26.89);Pain;Muscle weakness (generalized) (M62.81) ?Pain - Right/Left: Right ?Pain - part of body: Ankle and joints of foot ?  ? ? ?Time: 7616-0737 ?PT Time Calculation (min) (ACUTE ONLY): 22 min ? ?Charges:  $Therapeutic Activity: 8-22 mins          ?          ? ?Lorie Apley, SPT ?Acute Rehab Services ? ? ? ?Lorie Apley ?02/21/2022, 11:52 AM ? ?

## 2022-02-21 NOTE — TOC Transition Note (Signed)
Transition of Care (TOC) - CM/SW Discharge Note ? ? ?Patient Details  ?Name: Dashae Wilcher ?MRN: [id number] ?Date of Birth: [birth year] ? ?Transition of Care [facility]) CM/SW Contact:  ?Lorri Frederick, LCSW ?Phone Number: ?02/21/2022, 12:53 PM ? ? ?Clinical Narrative:   Pt discharging home with [facility].  Wheelchair ordered from [employer], which will be delivered to home address.  Pt nephew is picking pt up.   ? ? ? ?Final next level of care: Home w Home Health Services ?  ? ? ?Patient Goals and CMS Choice ?Patient states their goals for this hospitalization and ongoing recovery are:: return home ?  ?Choice offered to / list presented to : Patient ? ?Discharge Placement ?  ?           ?  ?  ?  ?  ? ?Discharge Plan and Services ?  ?Discharge Planning Services: CM Consult ?Post Acute Care Choice: Home Health          ?DME Arranged: Wheelchair manual ?DME Agency: [employer] ?Date DME Agency Contacted: 02/21/22 ?Time DME Agency Contacted: 1252 ?Representative spoke with at DME Agency: Velna Hatchet ?HH Arranged: PT, OT ?HH Agency: [facility] ?Date HH Agency Contacted: 02/19/22 ?Time HH Agency Contacted: 1124 ?Representative spoke with at [facility] Agency: Kandee Keen ? ?Social Determinants of Health (SDOH) Interventions ?  ? ? ?Readmission Risk Interventions ?No flowsheet data found. ? ? ? ? ?

## 2022-02-21 NOTE — Discharge Summary (Signed)
Discharge Diagnoses:  Principal Problem:   Posterior tibial tendon dysfunction (PTTD) of right lower extremity   Surgeries: Procedure(s): RIGHT TALONAVICULAR AND SUBTALAR FUSION on 02/18/2022    Consultants:   Discharged Condition: Improved  Hospital Course: Kristine [name] is an 88 y.o. female who was admitted 02/18/2022 with a chief complaint of posterior tibial tendon insufficiency on the right, with a final diagnosis of Right Posterior Tibial Tendon Insufficiency.  Patient was brought to the operating room on 02/18/2022 and underwent Procedure(s): RIGHT TALONAVICULAR AND SUBTALAR FUSION.    Patient was given perioperative antibiotics:  Anti-infectives (From admission, onward)    Start     Dose/Rate Route Frequency Ordered Stop   02/18/22 1600  ceFAZolin (ANCEF) IVPB 1 g/50 mL premix        1 g 100 mL/hr over 30 Minutes Intravenous Every 6 hours 02/18/22 1433 02/19/22 0528   02/18/22 0730  ceFAZolin (ANCEF) IVPB 2g/100 mL premix        2 g 200 mL/hr over 30 Minutes Intravenous On call to O.R. 02/18/22 4098 02/18/22 0954     .  Patient was given sequential compression devices, early ambulation, and aspirin for DVT prophylaxis.  Recent vital signs: Patient Vitals for the past 24 hrs:  BP Temp Temp src Pulse Resp SpO2  02/20/22 2055 (!) 110/55 99.3 F (37.4 C) Oral 88 20 100 %  02/20/22 1552 98/66 98.3 F (36.8 C) Oral 80 14 100 %  02/20/22 0810 114/70 98 F (36.7 C) -- (!) 104 18 99 %  .  Recent laboratory studies: No results found.  Discharge Medications:   Allergies as of 02/21/2022       Reactions   Iron Nausea And Vomiting   Other reaction(s): GI Upset (intolerance) GI Upset GI Upset   Advair Diskus [fluticasone-salmeterol] Other (See Comments)   HR and BP go up   Clavulanic Acid    Relafen [nabumetone] Nausea Only   Amoxicillin [amoxicillin] Rash        Medication List     TAKE these medications    acetaminophen 500 MG tablet Commonly known as:  TYLENOL Take 1,000 mg by mouth every 4 (four) hours as needed (Arthritis pain).   albuterol 108 (90 Base) MCG/ACT inhaler Commonly known as: VENTOLIN HFA Inhale 2 puffs into the lungs every 6 (six) hours as needed for wheezing or shortness of breath.   Calcium Carb-Cholecalciferol [phone] MG-UNIT Caps Take 1 tablet by mouth 2 (two) times daily.   cetirizine 10 MG tablet Commonly known as: ZYRTEC Take 10 mg by mouth daily.   diclofenac Sodium 1 % Gel Commonly known as: VOLTAREN Apply 1 application topically daily as needed for pain.   DULoxetine 60 MG capsule Commonly known as: CYMBALTA Take 60 mg by mouth daily.   fluticasone 50 MCG/ACT nasal spray Commonly known as: FLONASE Place 1 spray into both nostrils daily as needed for congestion.   furosemide 20 MG tablet Commonly known as: LASIX Take 20 mg by mouth every other day.   gabapentin 300 MG capsule Commonly known as: NEURONTIN Take 600-900 mg by mouth See admin instructions. Take 600 mg in the morning and 900 mg at bedtime   GLUCOSAMINE COMPLEX PO Take 2,000 mg by mouth 2 (two) times daily.   HYDROcodone-acetaminophen 5-325 MG tablet Commonly known as: NORCO/VICODIN Take 1 tablet by mouth every 4 (four) hours as needed. What changed:  when to take this reasons to take this   ipratropium-albuterol 0.5-2.5 (3) MG/3ML Soln Commonly known  as: DUONEB Take 3 mLs by nebulization daily as needed (Congestion).   losartan 25 MG tablet Commonly known as: COZAAR Take 25 mg by mouth daily.   meclizine 25 MG tablet Commonly known as: ANTIVERT Take 25 mg by mouth daily as needed for dizziness.   montelukast 10 MG tablet Commonly known as: SINGULAIR Take 10 mg by mouth at bedtime.   OMEGA 3-6-9 FATTY ACIDS PO Take 1 capsule by mouth 2 (two) times daily.   omeprazole 40 MG capsule Commonly known as: PRILOSEC Take 40 mg by mouth every morning.   OXYGEN Inhale 2 L into the lungs daily as needed (Shortness of  breath).   polyethylene glycol powder 17 GM/SCOOP powder Commonly known as: GLYCOLAX/MIRALAX Take 17 g by mouth daily as needed for moderate constipation or mild constipation.   simvastatin 40 MG tablet Commonly known as: ZOCOR Take 40 mg by mouth at bedtime.   Turmeric 500 MG Caps Take 500 mg by mouth 2 (two) times daily.               Discharge Care Instructions  (From admission, onward)           Start     Ordered   02/21/22 0000  Weight bearing as tolerated       Question Answer Comment  Laterality bilateral   Extremity Lower      02/21/22 0744   02/19/22 0000  Touch down weight bearing       Question Answer Comment  Laterality right   Extremity Lower      02/19/22 0907            Diagnostic Studies: No results found.  Patient benefited maximally from their hospital stay and there were no complications.     Disposition: Discharge disposition: 01-Home or Self Care      Discharge Instructions     Call MD / Call 911   Complete by: As directed    If you experience chest pain or shortness of breath, CALL 911 and be transported to the hospital emergency room.  If you develope a fever above 101 F, pus (white drainage) or increased drainage or redness at the wound, or calf pain, call your surgeon's office.   Call MD / Call 911   Complete by: As directed    If you experience chest pain or shortness of breath, CALL 911 and be transported to the hospital emergency room.  If you develope a fever above 101 F, pus (white drainage) or increased drainage or redness at the wound, or calf pain, call your surgeon's office.   Constipation Prevention   Complete by: As directed    Drink plenty of fluids.  Prune juice may be helpful.  You may use a stool softener, such as Colace (over the counter) 100 mg twice a day.  Use MiraLax (over the counter) for constipation as needed.   Constipation Prevention   Complete by: As directed    Drink plenty of fluids.  Prune  juice may be helpful.  You may use a stool softener, such as Colace (over the counter) 100 mg twice a day.  Use MiraLax (over the counter) for constipation as needed.   Diet - low sodium heart healthy   Complete by: As directed    Diet - low sodium heart healthy   Complete by: As directed    Increase activity slowly as tolerated   Complete by: As directed    Increase activity slowly as tolerated  Complete by: As directed    Post-op shoe   Complete by: As directed    Post-operative opioid taper instructions:   Complete by: As directed    POST-OPERATIVE OPIOID TAPER INSTRUCTIONS: It is important to wean off of your opioid medication as soon as possible. If you do not need pain medication after your surgery it is ok to stop day one. Opioids include: Codeine, Hydrocodone(Norco, Vicodin), Oxycodone(Percocet, oxycontin) and hydromorphone amongst others.  Long term and even short term use of opiods can cause: Increased pain response Dependence Constipation Depression Respiratory depression And more.  Withdrawal symptoms can include Flu like symptoms Nausea, vomiting And more Techniques to manage these symptoms Hydrate well Eat regular healthy meals Stay active Use relaxation techniques(deep breathing, meditating, yoga) Do Not substitute Alcohol to help with tapering If you have been on opioids for less than two weeks and do not have pain than it is ok to stop all together.  Plan to wean off of opioids This plan should start within one week post op of your joint replacement. Maintain the same interval or time between taking each dose and first decrease the dose.  Cut the total daily intake of opioids by one tablet each day Next start to increase the time between doses. The last dose that should be eliminated is the evening dose.      Post-operative opioid taper instructions:   Complete by: As directed    POST-OPERATIVE OPIOID TAPER INSTRUCTIONS: It is important to wean off of  your opioid medication as soon as possible. If you do not need pain medication after your surgery it is ok to stop day one. Opioids include: Codeine, Hydrocodone(Norco, Vicodin), Oxycodone(Percocet, oxycontin) and hydromorphone amongst others.  Long term and even short term use of opiods can cause: Increased pain response Dependence Constipation Depression Respiratory depression And more.  Withdrawal symptoms can include Flu like symptoms Nausea, vomiting And more Techniques to manage these symptoms Hydrate well Eat regular healthy meals Stay active Use relaxation techniques(deep breathing, meditating, yoga) Do Not substitute Alcohol to help with tapering If you have been on opioids for less than two weeks and do not have pain than it is ok to stop all together.  Plan to wean off of opioids This plan should start within one week post op of your joint replacement. Maintain the same interval or time between taking each dose and first decrease the dose.  Cut the total daily intake of opioids by one tablet each day Next start to increase the time between doses. The last dose that should be eliminated is the evening dose.      Touch down weight bearing   Complete by: As directed    Laterality: right   Extremity: Lower   Weight bearing as tolerated   Complete by: As directed    Laterality: bilateral   Extremity: Lower       Follow-up Information     Nadara Mustard, MD Follow up in 1 week(s).   Specialty: Orthopedic Surgery Contact information: [address] [place] [address] [phone]         Care, [facility] Follow up.   Specialty: Home Health Services Why: [facility] will contact you to schedule home visit Contact information: [address] [place] [address] [phone]                  Signed: Nadara Mustard 02/21/2022, 7:44 AM

## 2022-02-21 NOTE — Progress Notes (Signed)
Patient ID: Kristine [name], female   DOB: Nov 25, 1931, 88 y.o.   MRN: [id number] ? ?Patient is status post talonavicular and subtalar fusion on the right.  Therapy has recommended skilled nursing placement.  Patient refuses skilled nursing and states that she has 2 people at home to help her.  We will plan for discharge to home today will allow her to be weightbearing as tolerated on the right foot with the fracture boot in place. ?

## 2022-02-21 NOTE — Progress Notes (Signed)
Discharge paperwork given to Louis Gaw and placed in Discharge Packet.  ?

## 2022-02-21 NOTE — Evaluation (Signed)
Occupational Therapy Evaluation Patient Details Name: Kristine [name] MRN: [id number] DOB: August 07, 1931 Today's Date: 02/21/2022   History of Present Illness Pt is an 88 y.o. 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 PMH: HTN, GERD, asthma, OA, h/o back sx, noturnal O2 at home   Clinical Impression   Pt required assistance at baseline with ADLs and functional mobility, reports granddaughter assists with showering and dressing at times, uses rollator. Pt currently min-max A for ADLs, min A +2 for bed mobility, and min A +2 for step pivot transfers. Pt now WBAT in CAM boot, however still reports she is unable to put much weight onto RLE. Pt presenting with impairments listed below, will follow acutely. Pt refusing SNF at this time, recommend d/c home with 24/7 assistance and HHOT.      Recommendations for follow up therapy are one component of a multi-disciplinary discharge planning process, led by the attending physician.  Recommendations may be updated based on patient status, additional functional criteria and insurance authorization.   Follow Up Recommendations  Home health OT (pt refusing SNF, has 2 person 24/7 help at home)    Assistance Recommended at Discharge Intermittent Supervision/Assistance  Patient can return home with the following A lot of help with walking and/or transfers;A lot of help with bathing/dressing/bathroom;Help with stairs or ramp for entrance;Assistance with cooking/housework    Functional Status Assessment  Patient has had a recent decline in their functional status and demonstrates the ability to make significant improvements in function in a reasonable and predictable amount of time.  Equipment Recommendations  None recommended by OT;Other (comment) (pt has all needed DME)    Recommendations for Other Services       Precautions / Restrictions Precautions Precautions: Fall Required Braces or Orthoses:  Other Brace Other Brace: CAM boot RLE Restrictions Weight Bearing Restrictions: Yes RLE Weight Bearing: Weight bearing as tolerated Other Position/Activity Restrictions: WBAT in boot as of 3/6 per Dr. Sharol Given      Mobility Bed Mobility Overal bed mobility: Needs Assistance Bed Mobility: Supine to Sit, Sit to Supine     Supine to sit: Min assist Sit to supine: +2 for physical assistance   General bed mobility comments: increased time needed    Transfers Overall transfer level: Needs assistance Equipment used: Rolling walker (2 wheels) Transfers: Sit to/from Stand, Bed to chair/wheelchair/BSC Sit to Stand: Min assist, +2 physical assistance     Step pivot transfers: Min assist, +2 physical assistance, From elevated surface     General transfer comment: short steps, increased time needed to step pivot to [facility]      Balance Overall balance assessment: Needs assistance Sitting-balance support: Bilateral upper extremity supported, Feet supported Sitting balance-Leahy Scale: Fair     Standing balance support: Reliant on assistive device for balance, Bilateral upper extremity supported, During functional activity Standing balance-Leahy Scale: Poor                             ADL either performed or assessed with clinical judgement   ADL Overall ADL's : Needs assistance/impaired Eating/Feeding: Set up;Sitting   Grooming: Set up;Sitting   Upper Body Bathing: Minimal assistance;Sitting   Lower Body Bathing: Maximal assistance;Sitting/lateral leans   Upper Body Dressing : Minimal assistance;Sitting Upper Body Dressing Details (indicate cue type and reason): dons gown on back side Lower Body Dressing: Maximal assistance;Sitting/lateral leans   Toilet Transfer: Minimal assistance;Rolling walker (2 wheels);+2 for physical  assistance;BSC/3in1;Ambulation Toilet Transfer Details (indicate cue type and reason): step pivot to [facility] Toileting- Clothing Manipulation and  Hygiene: Sit to/from stand;Moderate assistance Toileting - Clothing Manipulation Details (indicate cue type and reason): mod A in standing to perform pericare     Functional mobility during ADLs: Minimal assistance;+2 for physical assistance;Rolling walker (2 wheels)       Vision Baseline Vision/History: 1 Wears glasses Vision Assessment?: No apparent visual deficits     Perception     Praxis      Pertinent Vitals/Pain Pain Assessment Pain Assessment: Faces Pain Score: 4  Faces Pain Scale: Hurts little more Pain Location: R foot with movement Pain Descriptors / Indicators: Grimacing, Guarding Pain Intervention(s): Limited activity within patient's tolerance, Monitored during session     Hand Dominance Right   Extremity/Trunk Assessment Upper Extremity Assessment Upper Extremity Assessment: Overall WFL for tasks assessed   Lower Extremity Assessment Lower Extremity Assessment: Defer to PT evaluation   Cervical / Trunk Assessment Cervical / Trunk Assessment: Kyphotic   Communication Communication Communication: No difficulties   Cognition Arousal/Alertness: Awake/alert Behavior During Therapy: WFL for tasks assessed/performed Overall Cognitive Status: Impaired/Different from baseline Area of Impairment: Safety/judgement, Problem solving, Awareness, Following commands                       Following Commands: Follows one step commands inconsistently, Follows one step commands with increased time Safety/Judgement: Decreased awareness of safety, Decreased awareness of deficits Awareness: Intellectual Problem Solving: Decreased initiation, Difficulty sequencing, Requires verbal cues, Requires tactile cues       General Comments  2L nasal canula after ambulation and during repeated trasnfer. Significant education on proper breathing techniques and education about diaphragmatic breathing    Exercises     Shoulder Instructions      Home Living  Family/patient expects to be discharged to:: Private residence Living Arrangements: Children;Other relatives (lives with adult son (age ~45) and granddaughter (age ~30)) Available Help at Discharge: Family;Available 24 hours/day Type of Home: House Home Access: Ramped entrance     Home Layout: One level     Bathroom Shower/Tub: Occupational psychologist: Handicapped height     Home Equipment: [place] - single point;Shower [place] (2 wheels);Rollator (4 wheels);BSC/3in1;Wheelchair - manual   Additional Comments: states son and granddaughter will be able to assist at d/c with ADLs and transfers      Prior Functioning/Environment Prior Level of Function : Needs assist       Physical Assist : ADLs (physical)   ADLs (physical): IADLs Mobility Comments: rollator for amb, Mod I mobility ADLs Comments: granddaugther occasionally assists pt with showering,pt wears 2L O2 at night/baseline        OT Problem List: Decreased strength;Decreased range of motion;Decreased activity tolerance;Impaired balance (sitting and/or standing);Decreased knowledge of use of DME or AE;Decreased safety awareness;Cardiopulmonary status limiting activity      OT Treatment/Interventions: Self-care/ADL training;Therapeutic exercise;DME and/or AE instruction;Therapeutic activities;Visual/perceptual remediation/compensation;Patient/family education    OT Goals(Current goals can be found in the care plan section) Acute Rehab OT Goals Patient Stated Goal: to go home OT Goal Formulation: With patient Time For Goal Achievement: 03/07/22 Potential to Achieve Goals: Good ADL Goals Pt Will Perform Upper Body Dressing: with min assist;sitting Pt Will Perform Lower Body Dressing: with mod assist;sitting/lateral leans;sit to/from stand Pt Will Transfer to Toilet: ambulating;stand pivot transfer;with min guard assist;bedside commode Pt Will Perform Toileting - Clothing Manipulation and hygiene: with  min assist;sitting/lateral leans;sit to/from stand  OT Frequency: Min 2X/week    Co-evaluation              AM-PAC OT "6 Clicks" Daily Activity     Outcome Measure Help from another person eating meals?: None Help from another person taking care of personal grooming?: A Little Help from another person toileting, which includes using toliet, bedpan, or urinal?: A Lot Help from another person bathing (including washing, rinsing, drying)?: A Lot Help from another person to put on and taking off regular upper body clothing?: A Lot Help from another person to put on and taking off regular lower body clothing?: A Lot 6 Click Score: 15   End of Session Equipment Utilized During Treatment: Gait belt;Rolling walker (2 wheels) Nurse Communication: Mobility status  Activity Tolerance: Patient limited by fatigue Patient left: in chair;with call bell/phone within reach;with chair alarm set  OT Visit Diagnosis: Unsteadiness on feet (R26.81);Muscle weakness (generalized) (M62.81);Other abnormalities of gait and mobility (R26.89)                Time: [id number] OT Time Calculation (min): 27 min Charges:  OT General Charges $OT Visit: 1 Visit OT Evaluation $OT Eval Moderate Complexity: 1 Mod OT Treatments $Self Care/Home Management : 8-22 mins  Lynnda Child, OTD, OTR/L Acute Rehab [phone]) [phone] - [place] 02/21/2022, 12:14 PM

## 2022-02-21 NOTE — Plan of Care (Signed)
Pt ready for Discharge.  ?

## 2022-02-22 ENCOUNTER — Encounter (HOSPITAL_COMMUNITY): Payer: Self-pay | Admitting: Orthopedic Surgery

## 2022-02-24 ENCOUNTER — Telehealth: Payer: Self-pay | Admitting: Orthopedic Surgery

## 2022-02-24 NOTE — Telephone Encounter (Signed)
Pt is s/p right subtalar and talonavicular fusion. Has appt tomorrow. Will hold and discuss if pt should be participating in PT right now.  ?

## 2022-02-24 NOTE — Telephone Encounter (Signed)
[facility] req VO:  ?PT: one time a week for 1 week ?2 times a week for 3 week  ?1 time a week for 4 weeks.  ? ?CB [phone] ?

## 2022-02-25 ENCOUNTER — Ambulatory Visit (INDEPENDENT_AMBULATORY_CARE_PROVIDER_SITE_OTHER): Payer: Medicare HMO | Admitting: Family

## 2022-02-25 ENCOUNTER — Other Ambulatory Visit: Payer: Self-pay

## 2022-02-25 ENCOUNTER — Encounter: Payer: Self-pay | Admitting: Family

## 2022-02-25 ENCOUNTER — Telehealth: Payer: Self-pay | Admitting: Orthopedic Surgery

## 2022-02-25 DIAGNOSIS — M76821 Posterior tibial tendinitis, right leg: Secondary | ICD-10-CM

## 2022-02-25 NOTE — Progress Notes (Signed)
? ?  Post-Op Visit Note ?  ?Patient: Kristine [name]           ?Date of Birth: 04-26-1931           ?MRN: [id number] ?Visit Date: 02/25/2022 ?PCP: Pcp, No ? ?Chief Complaint: No chief complaint on file. ? ? ?HPI:  ?HPI ?The patient is an 88 year old woman seen 1 week status post talonavicular and subtalar fusion for posterior tibial tendon dysfunction she is currently having home health services with [facility] ?Ortho Exam ?Incisions are well approximated sutures healing well she does have scant serous drainage some mild erythema no cellulitis ? ?Visit Diagnoses:  ?1. Posterior tibial tendon dysfunction (PTTD) of right lower extremity   ? ? ?Plan: Begin daily Dial soap cleansing.  Dry dressing changes.  Nonweightbearing on the right lower extremity. ? ?May work with physical therapy on upper body strengthening.  Please no weightbearing on the right. ? ?Follow-Up Instructions: Return in about 1 week (around 03/04/2022).  ? ?Imaging: ?No results found. ? ?Orders:  ?No orders of the defined types were placed in this encounter. ? ?No orders of the defined types were placed in this encounter. ? ? ? ?PMFS History: ?Patient Active Problem List  ? Diagnosis Date Noted  ? Posterior tibial tendon dysfunction (PTTD) of right lower extremity   ? DDD (degenerative disc disease), lumbar 06/24/2015  ? ?Past Medical History:  ?Diagnosis Date  ? Arthritis   ? Asthma   ? Bronchitis   ? Cellulitis   ? left leg  ? GERD (gastroesophageal reflux disease)   ? Hiatal hernia   ? Hypertension   ? On home oxygen therapy 2 liters  ? PRN and sleeps with oxygen 2L  ? Oxygen decrease 12/19/2008  ? during colonoscopy and elbow surgery 4 yrs ago.   ? Pneumonia   ? hx of  ? Shortness of breath   ? with exertion  ? Uses roller walker   ?  ?History reviewed. No pertinent family history.  ?Past Surgical History:  ?Procedure Laterality Date  ? BACK SURGERY    ? CARPAL TUNNEL RELEASE Bilateral 80's  ? COLONOSCOPY    ? FOOT ARTHRODESIS Right 02/18/2022  ?  Procedure: RIGHT TALONAVICULAR AND SUBTALAR FUSION;  Surgeon: Nadara Mustard, MD;  [facility];  Service: Orthopedics;  Laterality: Right;  ? FOOT SURGERY Right 2022  ? removed bunion  ? FRACTURE SURGERY Right   ? elbow  ? HIP ARTHROPLASTY Right   ? JOINT REPLACEMENT  right knee  ? OVARIAN CYST REMOVAL  50's  ? POSTERIOR LUMBAR FUSION 4 LEVEL N/A 06/24/2015  ? Procedure: Posterior Lateral Fusion - T9 -L2 with Iliac Crest Bone Graft;  Surgeon: Donalee Citrin, MD;  Location: [facility];  Service: Neurosurgery;  Laterality: N/A;  Posterior Lateral Fusion - T9 -L2 with Iliac Crest Bone Graft  ? ROTATOR CUFF REPAIR Right   ? VEIN SURGERY Left 1970s  ? leg   ? ?Social History  ? ?Occupational History  ? Not on file  ?Tobacco Use  ? Smoking status: Former  ?  Types: Cigarettes  ?  Quit date: 1970  ?  Years since quitting: 53.2  ? Smokeless tobacco: Never  ?Vaping Use  ? Vaping Use: Never used  ?Substance and Sexual Activity  ? Alcohol use: No  ? Drug use: No  ? Sexual activity: Yes  ?  Birth control/protection: Post-menopausal  ? ? ?

## 2022-02-25 NOTE — Telephone Encounter (Signed)
Raj called. He is the OT working at home with the patient. He would like verbal orders for OT 1x wk for 8 wks. His call back number is [phone] ?

## 2022-02-28 NOTE — Telephone Encounter (Signed)
Pending to discuss with Denny Peon ?

## 2022-02-28 NOTE — Telephone Encounter (Signed)
Pending to discuss with Erin ?

## 2022-03-01 NOTE — Telephone Encounter (Signed)
Per Denny Peon pt should not be in PT right now, should not be walking on her foot. Hold off on any PT at this point. We will f/u with her in office on 03/07/22 ?

## 2022-03-02 ENCOUNTER — Telehealth: Payer: Self-pay

## 2022-03-02 NOTE — Telephone Encounter (Signed)
Pt needs assistance with Home Health Care --Like bathing, and helping out 3 times a week, ?She called her Insurance and they advised her to call Dr Burna Sis office. ? ? ?Please call the pt back to advise  ?

## 2022-03-02 NOTE — Telephone Encounter (Signed)
Pt informed. Sent referral to [facility] for hh aide. ?

## 2022-03-07 ENCOUNTER — Ambulatory Visit (INDEPENDENT_AMBULATORY_CARE_PROVIDER_SITE_OTHER): Payer: Medicare PPO

## 2022-03-07 ENCOUNTER — Ambulatory Visit: Payer: Medicare PPO | Admitting: Orthopedic Surgery

## 2022-03-07 ENCOUNTER — Ambulatory Visit (INDEPENDENT_AMBULATORY_CARE_PROVIDER_SITE_OTHER): Payer: Medicare PPO | Admitting: Orthopedic Surgery

## 2022-03-07 DIAGNOSIS — M25571 Pain in right ankle and joints of right foot: Secondary | ICD-10-CM

## 2022-03-07 DIAGNOSIS — M76821 Posterior tibial tendinitis, right leg: Secondary | ICD-10-CM

## 2022-03-08 ENCOUNTER — Encounter: Payer: Self-pay | Admitting: Orthopedic Surgery

## 2022-03-08 NOTE — Progress Notes (Signed)
? ?Office Visit Note ?  ?Patient: Kristine [name]           ?Date of Birth: 06/22/1931           ?MRN: [id number] ?Visit Date: 03/07/2022 ?             ?Requested by: No referring provider defined for this encounter. ?PCP: Pcp, No ? ?Chief Complaint  ?Patient presents with  ? Right Foot - Routine Post Op  ?  02/18/2022 right talonavicular and subtalar fusion with Kerecis micro graft   ? ? ? ? ?HPI: ?Patient is a 88 year old woman who presents in follow-up for talonavicular and subtalar fusion for posterior tibial tendon insufficiency she is 2 weeks out from surgery.  Kerecis and stay graft was used for the tissue graft. ? ?Assessment & Plan: ?Visit Diagnoses:  ?1. Pain in right ankle and joints of right foot   ?2. Posterior tibial tendon dysfunction (PTTD) of right lower extremity   ? ? ?Plan: Patient will increase her weightbearing as tolerated with the fracture boot for the right lower extremity. ? ?Follow-Up Instructions: Return in about 3 weeks (around 03/28/2022).  ? ?Ortho Exam ? ?Patient is alert, oriented, no adenopathy, well-dressed, normal affect, normal respiratory effort. ?Examination the incisions are well-healed there is no redness cellulitis no signs of infection.  We will harvest the sutures today. ? ?Imaging: ?XR Foot Complete Right ? ?Result Date: 03/08/2022 ?Three-view radiographs of the right foot shows stable internal fixation of the talonavicular and subtalar fusion.  ? ? ?Labs: ?No results found for: HGBA1C, ESRSEDRATE, CRP, LABURIC, REPTSTATUS, GRAMSTAIN, CULT, LABORGA ? ? ?No results found for: ALBUMIN, PREALBUMIN, CBC ? ?No results found for: MG ?No results found for: VD25OH ? ?No results found for: PREALBUMIN ?CBC EXTENDED Latest Ref Rng & Units 02/18/2022 06/15/2015 11/17/2011  ?WBC 4.0 - 10.5 K/uL 6.5 6.2 10.6(H)  ?RBC 3.87 - 5.11 MIL/uL 3.66(L) 3.74(L) 2.80(L)  ?HGB 12.0 - 15.0 g/dL 11.6(L) 11.6(L) 8.6(L)  ?HCT 36.0 - 46.0 % 36.1 35.7(L) 25.6(L)  ?PLT 150 - 400 K/uL 191 218 147(L)   ?NEUTROABS 1.7 - 7.7 K/uL - - 7.8(H)  ?LYMPHSABS 0.7 - 4.0 K/uL - - 1.8  ? ? ? ?There is no height or weight on file to calculate BMI. ? ?Orders:  ?Orders Placed This Encounter  ?Procedures  ? XR Foot Complete Right  ? ?No orders of the defined types were placed in this encounter. ? ? ? Procedures: ?No procedures performed ? ?Clinical Data: ?No additional findings. ? ?ROS: ? ?All other systems negative, except as noted in the HPI. ?Review of Systems ? ?Objective: ?Vital Signs: LMP  (LMP Unknown)  ? ?Specialty Comments:  ?No specialty comments available. ? ?PMFS History: ?Patient Active Problem List  ? Diagnosis Date Noted  ? Posterior tibial tendon dysfunction (PTTD) of right lower extremity   ? DDD (degenerative disc disease), lumbar 06/24/2015  ? ?Past Medical History:  ?Diagnosis Date  ? Arthritis   ? Asthma   ? Bronchitis   ? Cellulitis   ? left leg  ? GERD (gastroesophageal reflux disease)   ? Hiatal hernia   ? Hypertension   ? On home oxygen therapy 2 liters  ? PRN and sleeps with oxygen 2L  ? Oxygen decrease 12/19/2008  ? during colonoscopy and elbow surgery 4 yrs ago.   ? Pneumonia   ? hx of  ? Shortness of breath   ? with exertion  ? Uses roller walker   ?  ?History  reviewed. No pertinent family history.  ?Past Surgical History:  ?Procedure Laterality Date  ? BACK SURGERY    ? CARPAL TUNNEL RELEASE Bilateral 80's  ? COLONOSCOPY    ? FOOT ARTHRODESIS Right 02/18/2022  ? Procedure: RIGHT TALONAVICULAR AND SUBTALAR FUSION;  Surgeon: Newt Minion, MD;  Location: [place];  Service: Orthopedics;  Laterality: Right;  ? FOOT SURGERY Right 2022  ? removed bunion  ? FRACTURE SURGERY Right   ? elbow  ? HIP ARTHROPLASTY Right   ? JOINT REPLACEMENT  right knee  ? OVARIAN CYST REMOVAL  50's  ? POSTERIOR LUMBAR FUSION 4 LEVEL N/A 06/24/2015  ? Procedure: Posterior Lateral Fusion - T9 -L2 with Iliac Crest Bone Graft;  Surgeon: Kary Kos, MD;  Location: [place] [facility];  Service: Neurosurgery;  Laterality: N/A;  Posterior  Lateral Fusion - T9 -L2 with Iliac Crest Bone Graft  ? ROTATOR CUFF REPAIR Right   ? VEIN SURGERY Left 1970s  ? leg   ? ?Social History  ? ?Occupational History  ? Not on file  ?Tobacco Use  ? Smoking status: Former  ?  Types: Cigarettes  ?  Quit date: 1970  ?  Years since quitting: 53.2  ? Smokeless tobacco: Never  ?Vaping Use  ? Vaping Use: Never used  ?Substance and Sexual Activity  ? Alcohol use: No  ? Drug use: No  ? Sexual activity: Yes  ?  Birth control/protection: Post-menopausal  ? ? ? ? ? ?

## 2022-03-28 ENCOUNTER — Ambulatory Visit (INDEPENDENT_AMBULATORY_CARE_PROVIDER_SITE_OTHER): Payer: Medicare PPO

## 2022-03-28 ENCOUNTER — Ambulatory Visit (INDEPENDENT_AMBULATORY_CARE_PROVIDER_SITE_OTHER): Payer: Medicare PPO | Admitting: Orthopedic Surgery

## 2022-03-28 DIAGNOSIS — M76821 Posterior tibial tendinitis, right leg: Secondary | ICD-10-CM | POA: Diagnosis not present

## 2022-03-29 ENCOUNTER — Encounter: Payer: Self-pay | Admitting: Orthopedic Surgery

## 2022-03-29 NOTE — Progress Notes (Signed)
? ?Office Visit Note ?  ?Patient: Kristine [name]           ?Date of Birth: 11-17-1931           ?MRN: [id number] ?Visit Date: 03/28/2022 ?             ?Requested by: No referring provider defined for this encounter. ?PCP: Pcp, No ? ?Chief Complaint  ?Patient presents with  ? Right Foot - Routine Post Op  ?  Right tal/sub fusion with kerecis micro graft.  ? ? ? ? ?HPI: ?Patient is an 88 year old woman who presents 5 weeks status post subtalar and talonavicular fusion for posterior tibial tendon insufficiency Kerecis micro graft was included with the fusion. ? ?Assessment & Plan: ?Visit Diagnoses:  ?1. Posterior tibial tendon dysfunction (PTTD) of right lower extremity   ? ? ?Plan: Continue with the fracture boot weightbearing as tolerated follow-up in 3 weeks repeat three-view radiographs of the right foot at which time anticipate she can advance to regular shoewear. ? ?Follow-Up Instructions: Return in about 3 weeks (around 04/18/2022).  ? ?Ortho Exam ? ?Patient is alert, oriented, no adenopathy, well-dressed, normal affect, normal respiratory effort. ?Examination the skin wrinkles well she is currently full weightbearing in the fracture boot and asymptomatic.  The incisions are well-healed there is no redness or cellulitis. ? ?Imaging: ?XR Foot Complete Right ? ?Result Date: 03/29/2022 ?The subtalar and talonavicular fusions are stable the hardware is stable without hardware failure.  ?No images are attached to the encounter. ? ?Labs: ?No results found for: HGBA1C, ESRSEDRATE, CRP, LABURIC, REPTSTATUS, GRAMSTAIN, CULT, LABORGA ? ? ?No results found for: ALBUMIN, PREALBUMIN, CBC ? ?No results found for: MG ?No results found for: VD25OH ? ?No results found for: PREALBUMIN ? ?  Latest Ref Rng & Units 02/18/2022  ?  8:06 AM 06/15/2015  ?  9:49 AM 11/17/2011  ? 12:15 PM  ?CBC EXTENDED  ?WBC 4.0 - 10.5 K/uL 6.5   6.2   10.6    ?RBC 3.87 - 5.11 MIL/uL 3.66   3.74   2.80    ?Hemoglobin 12.0 - 15.0 g/dL 79.4   80.1   8.6     ?HCT 36.0 - 46.0 % 36.1   35.7   25.6    ?Platelets 150 - 400 K/uL 191   218   147    ?NEUT# 1.7 - 7.7 K/uL   7.8    ?Lymph# 0.7 - 4.0 K/uL   1.8    ? ? ? ?There is no height or weight on file to calculate BMI. ? ?Orders:  ?Orders Placed This Encounter  ?Procedures  ? XR Foot Complete Right  ? ?No orders of the defined types were placed in this encounter. ? ? ? Procedures: ?No procedures performed ? ?Clinical Data: ?No additional findings. ? ?ROS: ? ?All other systems negative, except as noted in the HPI. ?Review of Systems ? ?Objective: ?Vital Signs: LMP  (LMP Unknown)  ? ?Specialty Comments:  ?No specialty comments available. ? ?PMFS History: ?Patient Active Problem List  ? Diagnosis Date Noted  ? Posterior tibial tendon dysfunction (PTTD) of right lower extremity   ? DDD (degenerative disc disease), lumbar 06/24/2015  ? ?Past Medical History:  ?Diagnosis Date  ? Arthritis   ? Asthma   ? Bronchitis   ? Cellulitis   ? left leg  ? GERD (gastroesophageal reflux disease)   ? Hiatal hernia   ? Hypertension   ? On home oxygen therapy 2 liters  ?  PRN and sleeps with oxygen 2L  ? Oxygen decrease 12/19/2008  ? during colonoscopy and elbow surgery 4 yrs ago.   ? Pneumonia   ? hx of  ? Shortness of breath   ? with exertion  ? Uses roller walker   ?  ?History reviewed. No pertinent family history.  ?Past Surgical History:  ?Procedure Laterality Date  ? BACK SURGERY    ? CARPAL TUNNEL RELEASE Bilateral 80's  ? COLONOSCOPY    ? FOOT ARTHRODESIS Right 02/18/2022  ? Procedure: RIGHT TALONAVICULAR AND SUBTALAR FUSION;  Surgeon: Nadara Mustard, MD;  [facility] OR;  Service: Orthopedics;  Laterality: Right;  ? FOOT SURGERY Right 2022  ? removed bunion  ? FRACTURE SURGERY Right   ? elbow  ? HIP ARTHROPLASTY Right   ? JOINT REPLACEMENT  right knee  ? OVARIAN CYST REMOVAL  50's  ? POSTERIOR LUMBAR FUSION 4 LEVEL N/A 06/24/2015  ? Procedure: Posterior Lateral Fusion - T9 -L2 with Iliac Crest Bone Graft;  Surgeon: Donalee Citrin, MD;   Location: [facility];  Service: Neurosurgery;  Laterality: N/A;  Posterior Lateral Fusion - T9 -L2 with Iliac Crest Bone Graft  ? ROTATOR CUFF REPAIR Right   ? VEIN SURGERY Left 1970s  ? leg   ? ?Social History  ? ?Occupational History  ? Not on file  ?Tobacco Use  ? Smoking status: Former  ?  Types: Cigarettes  ?  Quit date: 1970  ?  Years since quitting: 53.3  ? Smokeless tobacco: Never  ?Vaping Use  ? Vaping Use: Never used  ?Substance and Sexual Activity  ? Alcohol use: No  ? Drug use: No  ? Sexual activity: Yes  ?  Birth control/protection: Post-menopausal  ? ? ? ? ? ?

## 2022-04-18 ENCOUNTER — Ambulatory Visit (INDEPENDENT_AMBULATORY_CARE_PROVIDER_SITE_OTHER): Payer: Medicare PPO

## 2022-04-18 ENCOUNTER — Ambulatory Visit (INDEPENDENT_AMBULATORY_CARE_PROVIDER_SITE_OTHER): Payer: Medicare PPO | Admitting: Orthopedic Surgery

## 2022-04-18 DIAGNOSIS — M76821 Posterior tibial tendinitis, right leg: Secondary | ICD-10-CM | POA: Diagnosis not present

## 2022-04-19 ENCOUNTER — Telehealth: Payer: Self-pay | Admitting: Orthopedic Surgery

## 2022-04-19 NOTE — Telephone Encounter (Signed)
VO given to Raja for HHOT ?

## 2022-04-19 NOTE — Telephone Encounter (Signed)
Verbal Orders  ? ?1 x wk 1 ?1 X wk 4 ? ?Raja -- [phone] ?

## 2022-05-09 ENCOUNTER — Encounter: Payer: Self-pay | Admitting: Orthopedic Surgery

## 2022-05-09 NOTE — Progress Notes (Signed)
Office Visit Note   Patient: Kristine [name]           Date of Birth: 1931-06-09           MRN: [id number] Visit Date: 04/18/2022              Requested by: No referring provider defined for this encounter. PCP: Pcp, No  Chief Complaint  Patient presents with   Right Foot - Routine Post Op    02/18/2022 talonavicular and subtalar fusion       HPI: Patient is an 88 year old woman who is 2 months status post right talonavicular and subtalar fusion.  Kerecis micro powder and stay graft was used at the fusion site.  Patient has been weightbearing with a fracture boot feels well no concerns.  Assessment & Plan: Visit Diagnoses:  1. Posterior tibial tendon dysfunction (PTTD) of right lower extremity     Plan: Increase activities as tolerated.  She may advance to regular shoewear.  Recommend wearing compression stockings for the venous stasis ulcers on the left.  Follow-Up Instructions: Return if symptoms worsen or fail to improve.   Ortho Exam  Patient is alert, oriented, no adenopathy, well-dressed, normal affect, normal respiratory effort. Examination patient has no pain with walking the incision is well-healed.  Imaging: No results found. No images are attached to the encounter.  Labs: No results found for: HGBA1C, ESRSEDRATE, CRP, LABURIC, REPTSTATUS, GRAMSTAIN, CULT, LABORGA   No results found for: ALBUMIN, PREALBUMIN, CBC  No results found for: MG No results found for: VD25OH  No results found for: PREALBUMIN    Latest Ref Rng & Units 02/18/2022    8:06 AM 06/15/2015    9:49 AM 11/17/2011   12:15 PM  CBC EXTENDED  WBC 4.0 - 10.5 K/uL 6.5   6.2   10.6    RBC 3.87 - 5.11 MIL/uL 3.66   3.74   2.80    Hemoglobin 12.0 - 15.0 g/dL 11.6   11.6   8.6    HCT 36.0 - 46.0 % 36.1   35.7   25.6    Platelets 150 - 400 K/uL 191   218   147    NEUT# 1.7 - 7.7 K/uL   7.8    Lymph# 0.7 - 4.0 K/uL   1.8       There is no height or weight on file to calculate  BMI.  Orders:  Orders Placed This Encounter  Procedures   XR Foot Complete Right   No orders of the defined types were placed in this encounter.    Procedures: No procedures performed  Clinical Data: No additional findings.  ROS:  All other systems negative, except as noted in the HPI. Review of Systems  Objective: Vital Signs: LMP  (LMP Unknown)   Specialty Comments:  No specialty comments available.  PMFS History: Patient Active Problem List   Diagnosis Date Noted   Posterior tibial tendon dysfunction (PTTD) of right lower extremity    DDD (degenerative disc disease), lumbar 06/24/2015   Past Medical History:  Diagnosis Date   Arthritis    Asthma    Bronchitis    Cellulitis    left leg   GERD (gastroesophageal reflux disease)    Hiatal hernia    Hypertension    On home oxygen therapy 2 liters   PRN and sleeps with oxygen 2L   Oxygen decrease 12/19/2008   during colonoscopy and elbow surgery 4 yrs ago.    Pneumonia  hx of   Shortness of breath    with exertion   Uses roller walker     History reviewed. No pertinent family history.  Past Surgical History:  Procedure Laterality Date   BACK SURGERY     CARPAL TUNNEL RELEASE Bilateral 80's   COLONOSCOPY     FOOT ARTHRODESIS Right 02/18/2022   Procedure: RIGHT TALONAVICULAR AND SUBTALAR FUSION;  Surgeon: Newt Minion, MD;  Location: [facility];  Service: Orthopedics;  Laterality: Right;   FOOT SURGERY Right 2022   removed bunion   FRACTURE SURGERY Right    elbow   HIP ARTHROPLASTY Right    JOINT REPLACEMENT  right knee   OVARIAN CYST REMOVAL  50's   POSTERIOR LUMBAR FUSION 4 LEVEL N/A 06/24/2015   Procedure: Posterior Lateral Fusion - T9 -L2 with Iliac Crest Bone Graft;  Surgeon: Kary Kos, MD;  Location: [facility];  Service: Neurosurgery;  Laterality: N/A;  Posterior Lateral Fusion - T9 -L2 with Iliac Crest Bone Graft   ROTATOR CUFF REPAIR Right    VEIN SURGERY Left 1970s   leg    Social History    Occupational History   Not on file  Tobacco Use   Smoking status: Former    Types: Cigarettes    Quit date: 1970    Years since quitting: 53.4   Smokeless tobacco: Never  Vaping Use   Vaping Use: Never used  Substance and Sexual Activity   Alcohol use: No   Drug use: No   Sexual activity: Yes    Birth control/protection: Post-menopausal

## 2022-05-19 ENCOUNTER — Other Ambulatory Visit: Payer: Self-pay

## 2022-05-19 ENCOUNTER — Emergency Department (HOSPITAL_COMMUNITY): Payer: Medicare PPO

## 2022-05-19 ENCOUNTER — Observation Stay (HOSPITAL_COMMUNITY)
Admission: EM | Admit: 2022-05-19 | Discharge: 2022-05-26 | Disposition: A | Discharge status: Skilled Nursing Facility | Payer: Medicare PPO | Attending: Internal Medicine | Admitting: Internal Medicine

## 2022-05-19 ENCOUNTER — Encounter (HOSPITAL_COMMUNITY): Payer: Self-pay

## 2022-05-19 DIAGNOSIS — S82202A Unspecified fracture of shaft of left tibia, initial encounter for closed fracture: Secondary | ICD-10-CM | POA: Diagnosis present

## 2022-05-19 DIAGNOSIS — I129 Hypertensive chronic kidney disease with stage 1 through stage 4 chronic kidney disease, or unspecified chronic kidney disease: Secondary | ICD-10-CM | POA: Diagnosis not present

## 2022-05-19 DIAGNOSIS — J45909 Unspecified asthma, uncomplicated: Secondary | ICD-10-CM | POA: Insufficient documentation

## 2022-05-19 DIAGNOSIS — Z79899 Other long term (current) drug therapy: Secondary | ICD-10-CM | POA: Diagnosis not present

## 2022-05-19 DIAGNOSIS — L03116 Cellulitis of left lower limb: Secondary | ICD-10-CM | POA: Diagnosis not present

## 2022-05-19 DIAGNOSIS — W1839XA Other fall on same level, initial encounter: Secondary | ICD-10-CM | POA: Diagnosis not present

## 2022-05-19 DIAGNOSIS — Z96641 Presence of right artificial hip joint: Secondary | ICD-10-CM | POA: Diagnosis not present

## 2022-05-19 DIAGNOSIS — S82201A Unspecified fracture of shaft of right tibia, initial encounter for closed fracture: Secondary | ICD-10-CM | POA: Diagnosis present

## 2022-05-19 DIAGNOSIS — Z96652 Presence of left artificial knee joint: Secondary | ICD-10-CM | POA: Diagnosis not present

## 2022-05-19 DIAGNOSIS — S82102A Unspecified fracture of upper end of left tibia, initial encounter for closed fracture: Secondary | ICD-10-CM | POA: Diagnosis not present

## 2022-05-19 DIAGNOSIS — N1832 Chronic kidney disease, stage 3b: Secondary | ICD-10-CM | POA: Diagnosis not present

## 2022-05-19 DIAGNOSIS — G909 Disorder of the autonomic nervous system, unspecified: Secondary | ICD-10-CM | POA: Diagnosis not present

## 2022-05-19 DIAGNOSIS — M25562 Pain in left knee: Secondary | ICD-10-CM | POA: Diagnosis present

## 2022-05-19 DIAGNOSIS — W19XXXA Unspecified fall, initial encounter: Secondary | ICD-10-CM

## 2022-05-19 LAB — CBC WITH DIFFERENTIAL/PLATELET
Abs Immature Granulocytes: 0.02 10*3/uL (ref 0.00–0.07)
Basophils Absolute: 0 10*3/uL (ref 0.0–0.1)
Basophils Relative: 1 %
Eosinophils Absolute: 0.2 10*3/uL (ref 0.0–0.5)
Eosinophils Relative: 3 %
HCT: 34.3 % — ABNORMAL LOW (ref 36.0–46.0)
Hemoglobin: 11.1 g/dL — ABNORMAL LOW (ref 12.0–15.0)
Immature Granulocytes: 0 %
Lymphocytes Relative: 24 %
Lymphs Abs: 1.7 10*3/uL (ref 0.7–4.0)
MCH: 31.5 pg (ref 26.0–34.0)
MCHC: 32.4 g/dL (ref 30.0–36.0)
MCV: 97.4 fL (ref 80.0–100.0)
Monocytes Absolute: 0.6 10*3/uL (ref 0.1–1.0)
Monocytes Relative: 8 %
Neutro Abs: 4.7 10*3/uL (ref 1.7–7.7)
Neutrophils Relative %: 64 %
Platelets: 219 10*3/uL (ref 150–400)
RBC: 3.52 MIL/uL — ABNORMAL LOW (ref 3.87–5.11)
RDW: 13.8 % (ref 11.5–15.5)
WBC: 7.2 10*3/uL (ref 4.0–10.5)
nRBC: 0 % (ref 0.0–0.2)

## 2022-05-19 LAB — BASIC METABOLIC PANEL
Anion gap: 7 (ref 5–15)
BUN: 16 mg/dL (ref 8–23)
CO2: 28 mmol/L (ref 22–32)
Calcium: 9.6 mg/dL (ref 8.9–10.3)
Chloride: 106 mmol/L (ref 98–111)
Creatinine, Ser: 1.3 mg/dL — ABNORMAL HIGH (ref 0.44–1.00)
GFR, Estimated: 40 mL/min — ABNORMAL LOW (ref >60)
Glucose, Bld: 106 mg/dL — ABNORMAL HIGH (ref 70–99)
Potassium: 4.5 mmol/L (ref 3.5–5.1)
Sodium: 141 mmol/L (ref 135–145)

## 2022-05-19 MED ORDER — MONTELUKAST SODIUM 10 MG PO TABS
10.0000 mg | ORAL_TABLET | Freq: Every day | ORAL | Status: DC
Start: 2022-05-19 — End: 2022-05-21
  Administered 2022-05-20: 10 mg via ORAL
  Filled 2022-05-19 (×3): qty 1

## 2022-05-19 MED ORDER — LOSARTAN POTASSIUM 50 MG PO TABS: 25.0000 mg | ORAL_TABLET | Freq: Every day | ORAL | Status: DC

## 2022-05-19 MED ORDER — IPRATROPIUM-ALBUTEROL 0.5-2.5 (3) MG/3ML IN SOLN: 3.0000 mL | RESPIRATORY_TRACT | Status: DC | PRN

## 2022-05-19 MED ORDER — LORATADINE 10 MG PO TABS
10.0000 mg | ORAL_TABLET | Freq: Every day | ORAL | Status: DC
  Administered 2022-05-20 – 2022-05-26 (×7): 10 mg via ORAL
  Filled 2022-05-19 (×7): qty 1

## 2022-05-19 MED ORDER — ACETAMINOPHEN 500 MG PO TABS
1000.0000 mg | ORAL_TABLET | Freq: Once | ORAL | Status: AC
Start: 2022-05-19 — End: 2022-05-19
  Administered 2022-05-19: 1000 mg via ORAL
  Filled 2022-05-19: qty 2

## 2022-05-19 MED ORDER — DULOXETINE HCL 60 MG PO CPEP
60.0000 mg | ORAL_CAPSULE | Freq: Every day | ORAL | Status: DC
  Administered 2022-05-20 – 2022-05-26 (×7): 60 mg via ORAL
  Filled 2022-05-19 (×7): qty 1

## 2022-05-19 MED ORDER — HYDROMORPHONE HCL 1 MG/ML IJ SOLN
0.5000 mg | Freq: Once | INTRAMUSCULAR | Status: AC
  Administered 2022-05-19: 0.5 mg via INTRAVENOUS
  Filled 2022-05-19: qty 1

## 2022-05-19 MED ORDER — SIMVASTATIN 20 MG PO TABS
40.0000 mg | ORAL_TABLET | Freq: Every day | ORAL | Status: DC
  Administered 2022-05-20 – 2022-05-25 (×6): 40 mg via ORAL
  Filled 2022-05-19 (×6): qty 2

## 2022-05-19 MED ORDER — HYDROMORPHONE HCL 1 MG/ML IJ SOLN: 0.5000 mg | INTRAMUSCULAR | Status: DC | PRN

## 2022-05-19 MED ORDER — PANTOPRAZOLE SODIUM 40 MG PO TBEC
40.0000 mg | DELAYED_RELEASE_TABLET | Freq: Every day | ORAL | Status: DC
  Administered 2022-05-20 – 2022-05-26 (×7): 40 mg via ORAL
  Filled 2022-05-19 (×7): qty 1

## 2022-05-19 MED ORDER — MORPHINE SULFATE (PF) 2 MG/ML IV SOLN
2.0000 mg | INTRAVENOUS | Status: DC | PRN
  Administered 2022-05-19 – 2022-05-20 (×3): 2 mg via INTRAVENOUS
  Filled 2022-05-19 (×3): qty 1

## 2022-05-19 MED ORDER — HYDROCODONE-ACETAMINOPHEN 5-325 MG PO TABS
1.0000 | ORAL_TABLET | ORAL | Status: DC | PRN
  Administered 2022-05-20 – 2022-05-21 (×6): 1 via ORAL
  Filled 2022-05-19 (×6): qty 1

## 2022-05-19 MED ORDER — ENOXAPARIN SODIUM 40 MG/0.4ML IJ SOSY
40.0000 mg | PREFILLED_SYRINGE | INTRAMUSCULAR | Status: DC
  Administered 2022-05-19 – 2022-05-25 (×7): 40 mg via SUBCUTANEOUS
  Filled 2022-05-19 (×7): qty 0.4

## 2022-05-19 MED ORDER — GABAPENTIN 300 MG PO CAPS
900.0000 mg | ORAL_CAPSULE | Freq: Every day | ORAL | Status: DC
  Administered 2022-05-20 – 2022-05-25 (×6): 900 mg via ORAL
  Filled 2022-05-19 (×7): qty 3

## 2022-05-19 MED ORDER — POLYETHYLENE GLYCOL 3350 17 G PO PACK
17.0000 g | PACK | Freq: Every day | ORAL | Status: DC | PRN
Start: 2022-05-19 — End: 2022-05-21

## 2022-05-19 MED ORDER — ONDANSETRON HCL 4 MG/2ML IJ SOLN: 4.0000 mg | Freq: Four times a day (QID) | INTRAMUSCULAR | Status: DC | PRN

## 2022-05-19 MED ORDER — ONDANSETRON HCL 4 MG/2ML IJ SOLN
4.0000 mg | Freq: Once | INTRAMUSCULAR | Status: AC
  Administered 2022-05-19: 4 mg via INTRAVENOUS
  Filled 2022-05-19: qty 2

## 2022-05-19 MED ORDER — HYDROMORPHONE HCL 1 MG/ML IJ SOLN
0.5000 mg | INTRAMUSCULAR | Status: DC | PRN
Start: 2022-05-19 — End: 2022-05-19

## 2022-05-19 MED ORDER — HYDROCODONE-ACETAMINOPHEN 5-325 MG PO TABS
1.0000 | ORAL_TABLET | ORAL | Status: DC
  Administered 2022-05-19: 1 via ORAL
  Filled 2022-05-19 (×2): qty 1

## 2022-05-19 MED ORDER — GABAPENTIN 300 MG PO CAPS
600.0000 mg | ORAL_CAPSULE | Freq: Every day | ORAL | Status: DC
  Administered 2022-05-20 – 2022-05-26 (×7): 600 mg via ORAL
  Filled 2022-05-19 (×7): qty 2

## 2022-05-19 NOTE — Progress Notes (Signed)
Orthopedic Tech Progress Note Patient Details:  Kristine [name] 08-Oct-1931 [id number]  Ortho Devices Type of Ortho Device: Knee Immobilizer Ortho Device/Splint Location: lle Ortho Device/Splint Interventions: Ordered, Application, Adjustment   Post Interventions Patient Tolerated: Daron Offer 05/19/2022, 8:07 PM
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
## 2022-05-19 NOTE — ED Notes (Signed)
Pt provided orange juice and [place] sandwich at this time

## 2022-05-19 NOTE — Significant Event (Signed)
RN notified us of patient's hypotensive episodes. On presentation she is resting in bed comfortably in no acute distress, asymptomatic. MAP of 66, respiratory rate 16, HR of 70. Will hold pain medications.
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
## 2022-05-19 NOTE — ED Notes (Signed)
Admit paged due to pt current bp at this time. Holding pain meds until speak with admit provider

## 2022-05-19 NOTE — ED Notes (Signed)
Ortho tech called at this time.  

## 2022-05-19 NOTE — ED Notes (Signed)
Spoke with admit provider face to face reference pt current VS. Admit providers are now at bedside with pt. Advised pt admit providers to hold pain meds

## 2022-05-19 NOTE — ED Notes (Signed)
Received verbal report from Tiffany S RN at this time 

## 2022-05-20 DIAGNOSIS — S82102A Unspecified fracture of upper end of left tibia, initial encounter for closed fracture: Secondary | ICD-10-CM

## 2022-05-20 LAB — BASIC METABOLIC PANEL
Anion gap: 7 (ref 5–15)
BUN: 19 mg/dL (ref 8–23)
CO2: 25 mmol/L (ref 22–32)
Calcium: 8.7 mg/dL — ABNORMAL LOW (ref 8.9–10.3)
Chloride: 107 mmol/L (ref 98–111)
Creatinine, Ser: 1.57 mg/dL — ABNORMAL HIGH (ref 0.44–1.00)
GFR, Estimated: 32 mL/min — ABNORMAL LOW (ref >60)
Glucose, Bld: 126 mg/dL — ABNORMAL HIGH (ref 70–99)
Potassium: 5.2 mmol/L — ABNORMAL HIGH (ref 3.5–5.1)
Sodium: 139 mmol/L (ref 135–145)

## 2022-05-20 LAB — CBC
HCT: 30.9 % — ABNORMAL LOW (ref 36.0–46.0)
Hemoglobin: 9.7 g/dL — ABNORMAL LOW (ref 12.0–15.0)
MCH: 31.5 pg (ref 26.0–34.0)
MCHC: 31.4 g/dL (ref 30.0–36.0)
MCV: 100.3 fL — ABNORMAL HIGH (ref 80.0–100.0)
Platelets: 190 10*3/uL (ref 150–400)
RBC: 3.08 MIL/uL — ABNORMAL LOW (ref 3.87–5.11)
RDW: 13.9 % (ref 11.5–15.5)
WBC: 7.7 10*3/uL (ref 4.0–10.5)
nRBC: 0 % (ref 0.0–0.2)

## 2022-05-20 MED ORDER — DOXYCYCLINE HYCLATE 100 MG PO TABS
100.0000 mg | ORAL_TABLET | Freq: Two times a day (BID) | ORAL | Status: AC
Start: 2022-05-20 — End: 2022-05-24
  Administered 2022-05-20 – 2022-05-24 (×10): 100 mg via ORAL
  Filled 2022-05-20 (×10): qty 1

## 2022-05-20 NOTE — Evaluation (Signed)
Occupational Therapy Evaluation Patient Details Name: Kristine [name] MRN: [id number] DOB: 1931/05/28 Today's Date: 05/20/2022   History of Present Illness Pt is an 88 y/o female admitted secondary to fall. Found to have L tibia fx around TKA hardware. PMH includes R foot surgery, HTN, and CKD.   Clinical Impression   Pt admitted for concerns listed above. PTA Pt reported that she was independent with all ADL's and IADL's, using a rollator for mobility. At this time, pt is very limited by pain. She is not able to tolerate sitting EOB, requiring max-total A +2 for bed mobility and long sitting/attempting to sit EOB. Pt throwing herself backwards due to increased pain. Recommending SNF, as pt is requiring max-total A +2 for most tasks at bed level. If pt refuses SNF she will require max HH services (OT, PT, RN, Aide). OT will continue to follow acutely.       Recommendations for follow up therapy are one component of a multi-disciplinary discharge planning process, led by the attending physician.  Recommendations may be updated based on patient status, additional functional criteria and insurance authorization.   Follow Up Recommendations  Skilled nursing-short term rehab (<3 hours/day)    Assistance Recommended at Discharge Frequent or constant Supervision/Assistance  Patient can return home with the following Two people to help with walking and/or transfers;Two people to help with bathing/dressing/bathroom;Assistance with cooking/housework;Direct supervision/assist for medications management;Direct supervision/assist for financial management;Assist for transportation;Help with stairs or ramp for entrance    Functional Status Assessment  Patient has had a recent decline in their functional status and demonstrates the ability to make significant improvements in function in a reasonable and predictable amount of time.  Equipment Recommendations  None recommended by OT    Recommendations for  Other Services       Precautions / Restrictions Precautions Precautions: Fall;Other (comment) Precaution Comments: No ROM L knee Required Braces or Orthoses: Knee Immobilizer - Left Knee Immobilizer - Left: On at all times Restrictions Weight Bearing Restrictions: Yes LLE Weight Bearing: Weight bearing as tolerated Other Position/Activity Restrictions: WBAT in KI      Mobility Bed Mobility Overal bed mobility: Needs Assistance Bed Mobility: Supine to Sit, Sit to Supine     Supine to sit: Max assist, Total assist, +2 for physical assistance Sit to supine: Max assist, Total assist, +2 for physical assistance   General bed mobility comments: Pt very anxious throughout and requiring increased time to perform. Had pt dangle RLE over EOB and then when LLE brought over, pt yelling out in pain and bringing it back onto bed. Impulsively returned to supine secondary to pain.    Transfers                          Balance Overall balance assessment: Needs assistance Sitting-balance support: Bilateral upper extremity supported Sitting balance-Leahy Scale: Zero Sitting balance - Comments: max to total A and UE support                                   ADL either performed or assessed with clinical judgement   ADL Overall ADL's : Needs assistance/impaired Eating/Feeding: Set up;Bed level   Grooming: Set up;Bed level   Upper Body Bathing: Minimal assistance;Bed level   Lower Body Bathing: Total assistance;+2 for physical assistance;+2 for safety/equipment;Bed level   Upper Body Dressing : Minimal assistance;Bed level   Lower Body Dressing:  Total assistance;+2 for physical assistance;+2 for safety/equipment;Bed level       Toileting- Clothing Manipulation and Hygiene: Total assistance;+2 for physical assistance;+2 for safety/equipment;Bed level         General ADL Comments: Pt limited to bed level this session due to pain     Vision Baseline  Vision/History: 1 Wears glasses Ability to See in Adequate Light: 0 Adequate Patient Visual Report: No change from baseline Vision Assessment?: No apparent visual deficits     Perception     Praxis      Pertinent Vitals/Pain Pain Assessment Pain Assessment: 0-10 Pain Score: 10-Worst pain ever Pain Location: L knee and sometimes BLE Pain Descriptors / Indicators: Moaning, Sharp Pain Intervention(s): Limited activity within patient's tolerance, Repositioned     Hand Dominance     Extremity/Trunk Assessment Upper Extremity Assessment Upper Extremity Assessment: Generalized weakness   Lower Extremity Assessment Lower Extremity Assessment: Defer to PT evaluation RLE Deficits / Details: Recent R foot surgery. Limited ROM at ankle LLE Deficits / Details: Increased pain. Able to perform ankle pump. L knee in KI throughout   Cervical / Trunk Assessment Cervical / Trunk Assessment: Kyphotic   Communication Communication Communication: No difficulties   Cognition Arousal/Alertness: Awake/alert Behavior During Therapy: WFL for tasks assessed/performed Overall Cognitive Status: No family/caregiver present to determine baseline cognitive functioning                                 General Comments: Decreased awareness of safety given current deficits.     General Comments  Attempted to educate pt on safety and mobility needed if she chooses to go home. Pt thinks that she will be fine in a few days.    Exercises     Shoulder Instructions      Home Living Family/patient expects to be discharged to:: Private residence Living Arrangements: Children Available Help at Discharge: Family;Available 24 hours/day Type of Home: House Home Access: Ramped entrance     Home Layout: One level     Bathroom Shower/Tub: Producer, television/film/video: Handicapped height     Home Equipment: Cane - single point;Shower Counsellor (2 wheels);Rollator (4  wheels);BSC/3in1;Wheelchair - manual (bedpan)          Prior Functioning/Environment Prior Level of Function : Needs assist             Mobility Comments: Rollator for ambulation ADLs Comments: occasional need for assist when showering        OT Problem List: Decreased strength;Decreased range of motion;Decreased activity tolerance;Impaired balance (sitting and/or standing);Decreased cognition;Decreased safety awareness;Decreased knowledge of use of DME or AE;Pain      OT Treatment/Interventions: Self-care/ADL training;Therapeutic exercise;Energy conservation;DME and/or AE instruction;Therapeutic activities;Cognitive remediation/compensation;Patient/family education;Balance training    OT Goals(Current goals can be found in the care plan section) Acute Rehab OT Goals Patient Stated Goal: To reduce pain OT Goal Formulation: With patient Time For Goal Achievement: 06/03/22 Potential to Achieve Goals: Fair ADL Goals Pt Will Perform Lower Body Bathing: with mod assist;sitting/lateral leans;sit to/from stand Pt Will Perform Lower Body Dressing: with mod assist;sitting/lateral leans;sit to/from stand Pt Will Transfer to Toilet: with mod assist;stand pivot transfer Pt Will Perform Toileting - Clothing Manipulation and hygiene: with mod assist;sitting/lateral leans;sit to/from stand Additional ADL Goal #1: Pt will complete all aspects of bed mobility with min A. Additional ADL Goal #2: Pt will tolerate sitting EOB for 3-5 mins to complete grooming  tasks.  OT Frequency: Min 2X/week    Co-evaluation PT/OT/SLP Co-Evaluation/Treatment: Yes Reason for Co-Treatment: Complexity of the patient's impairments (multi-system involvement);For patient/therapist safety;To address functional/ADL transfers PT goals addressed during session: Mobility/safety with mobility;Balance OT goals addressed during session: Other (comment) (transfer)      AM-PAC OT "6 Clicks" Daily Activity     Outcome  Measure Help from another person eating meals?: A Little Help from another person taking care of personal grooming?: A Little Help from another person toileting, which includes using toliet, bedpan, or urinal?: Total Help from another person bathing (including washing, rinsing, drying)?: A Lot Help from another person to put on and taking off regular upper body clothing?: A Little Help from another person to put on and taking off regular lower body clothing?: Total 6 Click Score: 13   End of Session Equipment Utilized During Treatment: Oxygen Nurse Communication: Mobility status  Activity Tolerance: Patient limited by pain Patient left: in bed;with call bell/phone within reach  OT Visit Diagnosis: Unsteadiness on feet (R26.81);Other abnormalities of gait and mobility (R26.89);Muscle weakness (generalized) (M62.81);Pain Pain - Right/Left: Left Pain - part of body: Leg                Time: [id number] OT Time Calculation (min): 17 min Charges:  OT General Charges $OT Visit: 1 Visit OT Evaluation $OT Eval Moderate Complexity: 1 Mod  Inas Avena H., OTR/L Acute Rehabilitation  Dymir Neeson Elane Bing PlumeHaynes 05/20/2022, 12:39 PM

## 2022-05-20 NOTE — Plan of Care (Signed)
  Problem: Education: Goal: Knowledge of General Education information will improve Description: Including pain rating scale, medication(s)/side effects and non-pharmacologic comfort measures Outcome: Progressing   Problem: Safety: Goal: Ability to remain free from injury will improve Outcome: Progressing   Problem: Skin Integrity: Goal: Risk for impaired skin integrity will decrease Outcome: Progressing   Problem: Pain Managment: Goal: General experience of comfort will improve Outcome: Not Progressing   

## 2022-05-20 NOTE — Evaluation (Signed)
Physical Therapy Evaluation Patient Details Name: Kristine [name] MRN: [id number] DOB: 06/18/1931 Today's Date: 05/20/2022  History of Present Illness  Pt is an 88 y/o female admitted secondary to fall. Found to have L tibia fx around TKA hardware. PMH includes R foot surgery, HTN, and CKD.  Clinical Impression  Pt admitted secondary to problem above with deficits below. Pt requiring max to total A +2 for bed mobility tasks. Pt with increased pain and impulsively returning to supine. Stated she could not tolerate further mobility secondary to pain. Discussed current deficits and recommendations for SNF, however, pt adamant about returning home and reports her son can assist. If pt refuses SNF, recommend max HH services (HHPT/HHOT/HHRN/HHaide). Will continue to follow acutely.        Recommendations for follow up therapy are one component of a multi-disciplinary discharge planning process, led by the attending physician.  Recommendations may be updated based on patient status, additional functional criteria and insurance authorization.  Follow Up Recommendations Skilled nursing-short term rehab (<3 hours/day) (pt will likely refuse; if she refuses, max HH services.)    Assistance Recommended at Discharge Frequent or constant Supervision/Assistance  Patient can return home with the following  Two people to help with walking and/or transfers;Two people to help with bathing/dressing/bathroom;Assistance with cooking/housework;Help with stairs or ramp for entrance;Assist for transportation    Equipment Recommendations Hospital bed;Other (comment) (hoyer lift if they do not still have)  Recommendations for Other Services       Functional Status Assessment Patient has had a recent decline in their functional status and demonstrates the ability to make significant improvements in function in a reasonable and predictable amount of time.     Precautions / Restrictions Precautions Precautions:  Fall;Other (comment) Precaution Comments: No ROM L knee Required Braces or Orthoses: Knee Immobilizer - Left Knee Immobilizer - Left: On at all times Restrictions Weight Bearing Restrictions: Yes LLE Weight Bearing: Weight bearing as tolerated Other Position/Activity Restrictions: WBAT in KI      Mobility  Bed Mobility Overal bed mobility: Needs Assistance Bed Mobility: Supine to Sit, Sit to Supine     Supine to sit: Max assist, Total assist, +2 for physical assistance Sit to supine: Max assist, Total assist, +2 for physical assistance   General bed mobility comments: Pt very anxious throughout and requiring increased time to perform. Had pt dangle RLE over EOB and then when LLE brought over, pt yelling out in pain and bringing it back onto bed. Impulsively returned to supine secondary to pain.    Transfers                        Ambulation/Gait                  Stairs            Wheelchair Mobility    Modified Rankin (Stroke Patients Only)       Balance Overall balance assessment: Needs assistance Sitting-balance support: Bilateral upper extremity supported Sitting balance-Leahy Scale: Zero Sitting balance - Comments: max to total A and UE support                                     Pertinent Vitals/Pain Pain Assessment Pain Assessment: 0-10 Pain Score: 10-Worst pain ever Pain Location: L knee Pain Descriptors / Indicators: Moaning, Sharp Pain Intervention(s): Monitored during session, Limited activity within patient's tolerance,  Repositioned    Home Living Family/patient expects to be discharged to:: Private residence Living Arrangements: Children Available Help at Discharge: Family;Available 24 hours/day Type of Home: House Home Access: Ramped entrance       Home Layout: One level Home Equipment: Cane - single point;Shower Counsellor (2 wheels);Rollator (4 wheels);BSC/3in1;Wheelchair - manual (bedpan)       Prior Function Prior Level of Function : Needs assist             Mobility Comments: Rollator for ambulation ADLs Comments: occasional need for assist when showering     Hand Dominance        Extremity/Trunk Assessment   Upper Extremity Assessment Upper Extremity Assessment: Defer to OT evaluation    Lower Extremity Assessment Lower Extremity Assessment: RLE deficits/detail;LLE deficits/detail RLE Deficits / Details: Recent R foot surgery. Limited ROM at ankle LLE Deficits / Details: Increased pain. Able to perform ankle pump. L knee in KI throughout    Cervical / Trunk Assessment Cervical / Trunk Assessment: Kyphotic  Communication   Communication: No difficulties  Cognition Arousal/Alertness: Awake/alert Behavior During Therapy: WFL for tasks assessed/performed Overall Cognitive Status: No family/caregiver present to determine baseline cognitive functioning                                 General Comments: Decreased awareness of safety given current deficits.        General Comments General comments (skin integrity, edema, etc.): Discussed safety concerns with mobility and pt very adamant about returning home instead of SNF. reports she has a bed pan she can use and that her son will help her    Exercises     Assessment/Plan    PT Assessment Patient needs continued PT services  PT Problem List Decreased strength;Decreased range of motion;Decreased activity tolerance;Decreased balance;Decreased mobility;Decreased knowledge of use of DME;Decreased knowledge of precautions       PT Treatment Interventions DME instruction;Gait training;Therapeutic activities;Functional mobility training;Therapeutic exercise;Balance training;Patient/family education;Wheelchair mobility training    PT Goals (Current goals can be found in the Care Plan section)  Acute Rehab PT Goals Patient Stated Goal: to go home PT Goal Formulation: With patient Time For Goal  Achievement: 06/03/22 Potential to Achieve Goals: Fair    Frequency Min 3X/week     Co-evaluation PT/OT/SLP Co-Evaluation/Treatment: Yes Reason for Co-Treatment: For patient/therapist safety;To address functional/ADL transfers;Complexity of the patient's impairments (multi-system involvement) PT goals addressed during session: Mobility/safety with mobility;Balance         AM-PAC PT "6 Clicks" Mobility  Outcome Measure Help needed turning from your back to your side while in a flat bed without using bedrails?: Total Help needed moving from lying on your back to sitting on the side of a flat bed without using bedrails?: Total Help needed moving to and from a bed to a chair (including a wheelchair)?: Total Help needed standing up from a chair using your arms (e.g., wheelchair or bedside chair)?: Total Help needed to walk in hospital room?: Total Help needed climbing 3-5 steps with a railing? : Total 6 Click Score: 6    End of Session Equipment Utilized During Treatment: Left knee immobilizer Activity Tolerance: Patient limited by pain Patient left: in bed;with call bell/phone within reach (on stretcher in ED) Nurse Communication: Mobility status PT Visit Diagnosis: Other abnormalities of gait and mobility (R26.89);Unsteadiness on feet (R26.81);Muscle weakness (generalized) (M62.81);Difficulty in walking, not elsewhere classified (R26.2);Pain Pain - Right/Left: Left  Pain - part of body: Knee    Time: 2355-7322 PT Time Calculation (min) (ACUTE ONLY): 17 min   Charges:   PT Evaluation $PT Eval Moderate Complexity: 1 Mod          Farley Ly, PT, DPT  Acute Rehabilitation Services  Office: [phone]   Lehman Prom 05/20/2022, 12:04 PM

## 2022-05-20 NOTE — ED Notes (Signed)
Pt continues to have low bp and is currently asking for pain meds. Message sent to Dr. Darrick Huntsman at this time reference same

## 2022-05-20 NOTE — TOC Initial Note (Signed)
Transition of Care [facility]) - Initial/Assessment Note    Patient Details  Name: Kristine [name] MRN: [id number] Date of Birth: 12-28-1930  Transition of Care [facility]) CM/SW Contact:    Carley Hammed, LCSWA Phone Number: 05/20/2022, 3:36 PM  Clinical Narrative:                 Pt notified of PT recommendations for DC. Pt is adamant she would like to return home with support of her son. Pt stated CSW could reach out to pt's son to discuss specific needs. CSW called son, but VM was not set up. TOC will continue to follow for DC needs.  Expected Discharge Plan: Home w Home Health Services Barriers to Discharge: Continued Medical Work up   Patient Goals and CMS Choice Patient states their goals for this hospitalization and ongoing recovery are:: Pt states she would like to return home. CMS Medicare.gov Compare Post Acute Care list provided to:: Patient Choice offered to / list presented to : Patient  Expected Discharge Plan and Services Expected Discharge Plan: Home w Home Health Services     Post Acute Care Choice: Home Health Living arrangements for the past 2 months: Single Family Home                                      Prior Living Arrangements/Services Living arrangements for the past 2 months: Single Family Home Lives with:: Adult Children Patient language and need for interpreter reviewed:: Yes Do you feel safe going back to the place where you live?: Yes      Need for Family Participation in Patient Care: Yes (Comment) Care giver support system in place?: Yes (comment)   Criminal Activity/Legal Involvement Pertinent to Current Situation/Hospitalization: No - Comment as needed  Activities of Daily Living      Permission Sought/Granted Permission sought to share information with : Family Supports Permission granted to share information with : Yes, Verbal Permission Granted  Share Information with NAME: Olena Heckle     Permission granted to share info w  Relationship: Son  Permission granted to share info w Contact Information: [phone]  Emotional Assessment Appearance:: Appears stated age Attitude/Demeanor/Rapport: Engaged Affect (typically observed): Appropriate Orientation: : Oriented to Self, Oriented to Place, Oriented to  Time, Oriented to Situation Alcohol / Substance Use: Not Applicable Psych Involvement: No (comment)  Admission diagnosis:  Left tibial fracture [S82.202A] Right tibial fracture [S82.201A] Fall, initial encounter [W19.XXXA] Closed fracture of proximal end of left tibia, unspecified fracture morphology, initial encounter [S82.102A] Patient Active Problem List   Diagnosis Date Noted   Left tibial fracture 05/19/2022   Posterior tibial tendon dysfunction (PTTD) of right lower extremity    DDD (degenerative disc disease), lumbar 06/24/2015   PCP:  Pcp, No Pharmacy:   [facility] [place], [place] - [address] [place] [address] Phone: [phone] Fax: [phone]     Social Determinants of Health (SDOH) Interventions    Readmission Risk Interventions     View : No data to display.

## 2022-05-20 NOTE — ED Notes (Signed)
Per Dr. Ileene Musa pt can have Norco at this time.

## 2022-05-20 NOTE — Hospital Course (Addendum)
Left proximal tibial fracture 2/2 mechanical fall  History of knee replacement, 2019 Patient presented with 10/10 L knee pain after mechanical fall, had prior L knee replacement. XR with acute tibial fracture around the prosthetic, prosthetic remains intact. Ortho consulted in ED, recommended knee immobilizer and weightbearing as tolerated. Pain was managed with oxycodone, tylenol, lidocaine patches, and IV morphine PRN. PT/OT evaluated patient and recommended SNF at discharge which patient initially declined in favor of max HH services. She ultimately decided to go to SNF at discharge.  Nonpurulent cellulitis of LLE L anterior shin with erythematous, warm, and well demarcated area. Patient received a 5-day course of doxycycline during her admission.  Chronic kidney disease, stage IIIb Noted on labs throughout admission, stable. Day of discharge BUN/Cr 25/1.41, GFR 36.  Peripheral Neuropathy  Continued home gabapentin.  HTN  Home lasix, losartan were held. Patient was normotensive throughout admission.   HLD  Continued home simvastatin.   Depression  Continued home Cymbalta.
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
## 2022-05-20 NOTE — ED Notes (Signed)
Dr. Darrick Huntsman paged at this time by secretary reference pt c/o pain and requesting pain medication and current VS

## 2022-05-21 DIAGNOSIS — W19XXXA Unspecified fall, initial encounter: Secondary | ICD-10-CM | POA: Diagnosis not present

## 2022-05-21 DIAGNOSIS — S82102A Unspecified fracture of upper end of left tibia, initial encounter for closed fracture: Secondary | ICD-10-CM | POA: Diagnosis not present

## 2022-05-21 LAB — CBC
HCT: 31.6 % — ABNORMAL LOW (ref 36.0–46.0)
Hemoglobin: 10.3 g/dL — ABNORMAL LOW (ref 12.0–15.0)
MCH: 32.4 pg (ref 26.0–34.0)
MCHC: 32.6 g/dL (ref 30.0–36.0)
MCV: 99.4 fL (ref 80.0–100.0)
Platelets: 197 10*3/uL (ref 150–400)
RBC: 3.18 MIL/uL — ABNORMAL LOW (ref 3.87–5.11)
RDW: 13.4 % (ref 11.5–15.5)
WBC: 9.4 10*3/uL (ref 4.0–10.5)
nRBC: 0 % (ref 0.0–0.2)

## 2022-05-21 LAB — BASIC METABOLIC PANEL
Anion gap: 9 (ref 5–15)
BUN: 19 mg/dL (ref 8–23)
CO2: 22 mmol/L (ref 22–32)
Calcium: 8.7 mg/dL — ABNORMAL LOW (ref 8.9–10.3)
Chloride: 103 mmol/L (ref 98–111)
Creatinine, Ser: 1.26 mg/dL — ABNORMAL HIGH (ref 0.44–1.00)
GFR, Estimated: 41 mL/min — ABNORMAL LOW (ref >60)
Glucose, Bld: 106 mg/dL — ABNORMAL HIGH (ref 70–99)
Potassium: 5 mmol/L (ref 3.5–5.1)
Sodium: 134 mmol/L — ABNORMAL LOW (ref 135–145)

## 2022-05-21 MED ORDER — ALBUTEROL SULFATE HFA 108 (90 BASE) MCG/ACT IN AERS: 2.0000 | INHALATION_SPRAY | Freq: Four times a day (QID) | RESPIRATORY_TRACT | Status: DC | PRN

## 2022-05-21 MED ORDER — MONTELUKAST SODIUM 10 MG PO TABS
10.0000 mg | ORAL_TABLET | Freq: Every day | ORAL | Status: DC
  Administered 2022-05-21 – 2022-05-25 (×5): 10 mg via ORAL
  Filled 2022-05-21 (×5): qty 1

## 2022-05-21 MED ORDER — FLUTICASONE PROPIONATE 50 MCG/ACT NA SUSP
1.0000 | Freq: Every day | NASAL | Status: DC | PRN
  Filled 2022-05-21: qty 16

## 2022-05-21 MED ORDER — ALBUTEROL SULFATE (2.5 MG/3ML) 0.083% IN NEBU
2.5000 mg | INHALATION_SOLUTION | Freq: Four times a day (QID) | RESPIRATORY_TRACT | Status: DC | PRN
Start: 2022-05-21 — End: 2022-05-26
  Administered 2022-05-22 – 2022-05-23 (×2): 2.5 mg via RESPIRATORY_TRACT
  Filled 2022-05-21 (×2): qty 3

## 2022-05-21 MED ORDER — POLYETHYLENE GLYCOL 3350 17 G PO PACK
17.0000 g | PACK | Freq: Every day | ORAL | Status: DC
  Administered 2022-05-21 – 2022-05-26 (×6): 17 g via ORAL
  Filled 2022-05-21 (×5): qty 1

## 2022-05-21 MED ORDER — SENNOSIDES-DOCUSATE SODIUM 8.6-50 MG PO TABS
1.0000 | ORAL_TABLET | Freq: Every day | ORAL | Status: DC
  Administered 2022-05-21: 1 via ORAL
  Filled 2022-05-21: qty 1

## 2022-05-21 NOTE — TOC Progression Note (Signed)
Transition of Care [facility]) - Progression Note    Patient Details  Name: Sedra Morfin MRN: [id number] Date of Birth: Apr 24, 1931  [facility]) CM/SW Contact  Bess Kinds, RN Phone Number: [phone] 05/21/2022, 11:06 AM  Clinical Narrative:     Spoke with patient at the bedside to discuss post acute transition. Patient would like to return home with her son and have [facility]urb[facility] for [facility] PT/OT. Consent to speak with her son received. Spoke with her son at [phone] to discuss transition plans. Son stated that he spoke with his cousin, Dennie Bible, who advised that patient needed STR first before they could care for her at home. Son would prefer a SNF in the [place] area if possible. Advised of SNF workup and bed offers to follow.   Expected Discharge Plan: Skilled Nursing Facility Barriers to Discharge: Continued Medical Work up  Expected Discharge Plan and Services Expected Discharge Plan: Skilled Nursing Facility     Post Acute Care Choice: Home Health Living arrangements for the past 2 months: Single Family Home                                       Social Determinants of Health (SDOH) Interventions    Readmission Risk Interventions     View : No data to display.

## 2022-05-21 NOTE — Progress Notes (Signed)
Physical Therapy Treatment Patient Details Name: Austine Hackler MRN: [id number] DOB: 09/12/1931 Today's Date: 05/21/2022   History of Present Illness Pt is an 88 y/o female admitted secondary to fall. Found to have L tibia fx around TKA hardware. PMH includes R foot surgery, HTN, and CKD.    PT Comments    Pt received supine and agreeable to session, however greatly limited by pain despite premedication. Pt with LLE propped on pillow on entry, max attempts to assist pt to lift leg to remove pillows to begin bed mobility with pt crying out and flaling arms in pain with very minimal initiation of movement. Pt instructed on breathing  techniques and use of gait belt as strap to also assist LLE mobilization without success and pt stating she could no longer tolerate. Continued discussion and education on deficits, safety, and recommendations for SNF, however, pt continues to be adamant about returning home and reports her son can assist. Pt continues to benefit from skilled PT services to progress toward functional mobility goals.     Recommendations for follow up therapy are one component of a multi-disciplinary discharge planning process, led by the attending physician.  Recommendations may be updated based on patient status, additional functional criteria and insurance authorization.  Follow Up Recommendations  Skilled nursing-short term rehab (<3 hours/day) (pt will likely refuse; if she refuses, max HH services.)     Assistance Recommended at Discharge Frequent or constant Supervision/Assistance  Patient can return home with the following Two people to help with walking and/or transfers;Two people to help with bathing/dressing/bathroom;Assistance with cooking/housework;Help with stairs or ramp for entrance;Assist for transportation   Equipment Recommendations  Hospital bed;Other (comment) (hoyer lift if they do not still have)    Recommendations for Other Services       Precautions /  Restrictions Precautions Precautions: Fall;Other (comment) Precaution Comments: No ROM L knee Required Braces or Orthoses: Knee Immobilizer - Left Knee Immobilizer - Left: On at all times Restrictions Weight Bearing Restrictions: Yes LLE Weight Bearing: Weight bearing as tolerated Other Position/Activity Restrictions: WBAT in KI     Mobility  Bed Mobility Overal bed mobility: Needs Assistance             General bed mobility comments: unable to progress, pt with LLE propped on pillow on entry, max attempts to assist pt to lift leg to remove pillows to begin bed mobility with pt screaming and flaling arms with VERY slight movement despite premedication, breathing techniques and having pt use gait belt as strap to assist    Transfers                   General transfer comment: unable    Ambulation/Gait               General Gait Details: unable   Stairs             Wheelchair Mobility    Modified Rankin (Stroke Patients Only)       Balance Overall balance assessment: Needs assistance Sitting-balance support: Bilateral upper extremity supported Sitting balance-Leahy Scale: Zero Sitting balance - Comments: max to total A and UE support                                    Cognition Arousal/Alertness: Awake/alert Behavior During Therapy: WFL for tasks assessed/performed Overall Cognitive Status: No family/caregiver present to determine baseline cognitive functioning  General Comments: Decreased awareness of safety given current deficits.        Exercises      General Comments General comments (skin integrity, edema, etc.): Substatially increased time spent attempting to educate pt on safety and mobility needed if she chooses to go home and on benefits of SNF prior to home. Pt stating she continues to not want SNF and wants to go home.      Pertinent Vitals/Pain Pain  Assessment Pain Assessment: Faces Faces Pain Scale: Hurts worst Pain Location: L knee and sometimes BLE Pain Descriptors / Indicators: Moaning, Sharp, Crying Pain Intervention(s): Premedicated before session, Limited activity within patient's tolerance, Monitored during session    Home Living                          Prior Function            PT Goals (current goals can now be found in the care plan section) Acute Rehab PT Goals Patient Stated Goal: to go home PT Goal Formulation: With patient Time For Goal Achievement: 06/03/22    Frequency    Min 3X/week      PT Plan      Co-evaluation              AM-PAC PT "6 Clicks" Mobility   Outcome Measure  Help needed turning from your back to your side while in a flat bed without using bedrails?: Total Help needed moving from lying on your back to sitting on the side of a flat bed without using bedrails?: Total Help needed moving to and from a bed to a chair (including a wheelchair)?: Total Help needed standing up from a chair using your arms (e.g., wheelchair or bedside chair)?: Total Help needed to walk in hospital room?: Total Help needed climbing 3-5 steps with a railing? : Total 6 Click Score: 6    End of Session Equipment Utilized During Treatment: Left knee immobilizer Activity Tolerance: Patient limited by pain Patient left: in bed;with call bell/phone within reach Nurse Communication: Mobility status PT Visit Diagnosis: Other abnormalities of gait and mobility (R26.89);Unsteadiness on feet (R26.81);Muscle weakness (generalized) (M62.81);Difficulty in walking, not elsewhere classified (R26.2);Pain Pain - Right/Left: Left Pain - part of body: Knee     Time: [id number] PT Time Calculation (min) (ACUTE ONLY): 15 min  Charges:  $Therapeutic Activity: 8-22 mins                     [place] R. PTA Acute Rehabilitation Services Office: [place] 05/21/2022, 10:24 AM

## 2022-05-21 NOTE — Plan of Care (Signed)

## 2022-05-21 NOTE — NC FL2 (Signed)
[place] MEDICAID FL2 LEVEL OF CARE SCREENING TOOL     IDENTIFICATION  Patient Name: Kristine [name] Birthdate: [birth year] Sex: female Admission Date (Current Location): 05/19/2022  [facility] and [place] Number:  Producer, television/film/video and Address:  [facility], [address], [place], [place] [address]      Provider Number: [id number]  Attending Physician Name and Address:  Inez Catalina, MD  Relative Name and Phone Number:       Current Level of Care: Hospital Recommended Level of Care: Skilled Nursing Facility Prior Approval Number:    Date Approved/Denied:   PASRR Number: [id number]  Discharge Plan: SNF    Current Diagnoses: Patient Active Problem List   Diagnosis Date Noted   Left tibial fracture 05/19/2022   Posterior tibial tendon dysfunction (PTTD) of right lower extremity    DDD (degenerative disc disease), lumbar 06/24/2015    Orientation RESPIRATION BLADDER Height & Weight     Self, Time, Situation, Place  Normal Incontinent Weight: 115.7 kg Height:  5\' 5"  (165.1 cm)  BEHAVIORAL SYMPTOMS/MOOD NEUROLOGICAL BOWEL NUTRITION STATUS      Continent Diet  AMBULATORY STATUS COMMUNICATION OF NEEDS Skin   Extensive Assist Verbally Bruising, Skin abrasions (L knee)                       Personal Care Assistance Level of Assistance  Bathing, Dressing, Feeding Bathing Assistance: Maximum assistance Feeding assistance: Independent Dressing Assistance: Maximum assistance     Functional Limitations Info  Sight, Hearing, Speech Sight Info: Adequate Hearing Info: Adequate Speech Info: Adequate    SPECIAL CARE FACTORS FREQUENCY  PT (By licensed PT), OT (By licensed OT)     PT Frequency: PT at SNF to evaluate and treat a minimum of 5x/week OT Frequency: OT at SNF to evaluate and treat a minimum of 5x/week            Contractures Contractures Info: Not present    Additional Factors Info  Code Status, Allergies Code  Status Info: DNR Allergies Info: Advair, Clavulanic acid, Relafen, Amoxicillin           Current Medications (05/21/2022):  This is the current hospital active medication list Current Facility-Administered Medications  Medication Dose Route Frequency Provider Last Rate Last Admin   doxycycline (VIBRA-TABS) tablet 100 mg  100 mg Oral Q12H 07/21/2022, MD   100 mg at 05/21/22 0933   DULoxetine (CYMBALTA) DR capsule 60 mg  60 mg Oral Daily 07/21/22, MD   60 mg at 05/21/22 0934   enoxaparin (LOVENOX) injection 40 mg  40 mg Subcutaneous Q24H 07/21/22, MD   40 mg at 05/20/22 1837   gabapentin (NEURONTIN) capsule 600 mg  600 mg Oral Daily 07/20/22, MD   600 mg at 05/21/22 0934   gabapentin (NEURONTIN) capsule 900 mg  900 mg Oral QHS 07/21/22, MD   900 mg at 05/20/22 2144   HYDROcodone-acetaminophen (NORCO/VICODIN) 5-325 MG per tablet 1 tablet  1 tablet Oral Q4H PRN 2145, MD   1 tablet at 05/21/22 0937   ipratropium-albuterol (DUONEB) 0.5-2.5 (3) MG/3ML nebulizer solution 3 mL  3 mL Nebulization Q4H PRN 07/21/22, MD       loratadine (CLARITIN) tablet 10 mg  10 mg Oral Daily Evlyn Kanner, MD   10 mg at 05/21/22 0934   montelukast (SINGULAIR) tablet 10 mg  10 mg Oral 07/21/22, MD   10 mg at 05/20/22  2144   morphine (PF) 2 MG/ML injection 2 mg  2 mg Intravenous Q4H PRN Evlyn Kanner, MD   2 mg at 05/20/22 2252   ondansetron (ZOFRAN) injection 4 mg  4 mg Intravenous Q6H PRN Evlyn Kanner, MD       pantoprazole (PROTONIX) EC tablet 40 mg  40 mg Oral Daily Evlyn Kanner, MD   40 mg at 05/21/22 0934   polyethylene glycol (MIRALAX / GLYCOLAX) packet 17 g  17 g Oral Daily Debe Coder B, MD   17 g at 05/21/22 6945   senna-docusate (Senokot-S) tablet 1 tablet  1 tablet Oral QHS Inez Catalina, MD       simvastatin (ZOCOR) tablet 40 mg  40 mg Oral q1800 Evlyn Kanner, MD   40 mg at 05/20/22 0388      Discharge Medications: Please see discharge summary for a list of discharge medications.  Relevant Imaging Results:  Relevant Lab Results:   Additional Information SSN [id number]  Bess Kinds, RN

## 2022-05-21 NOTE — Progress Notes (Addendum)
IMTS Daily Note  Subjective: Kristine [name] is lying in bed when I see her.  Knee immobilizer off given she is lying down.  She reports no BM since being in the hospital.  We discussed recommendation for rehab and she is adamant about not going to a SNF at this time.  She notes concern about having a BM b/c she cannot get up to go to the bathroom.  I think this is concerning that she will not do well at home.  However, she reports having enough support at home.  We will maximize HH options, she is amenable to these services.   Objective:  Vital signs in last 24 hours: Vitals:   05/20/22 1306 05/20/22 2008 05/21/22 0442 05/21/22 0756  BP: 118/62 107/64 116/71 131/79  Pulse: 82 75 79 (!) 108  Resp: 17 16 16 16   Temp: 98.3 F (36.8 C) 98.2 F (36.8 C) 97.8 F (36.6 C) 99 F (37.2 C)  TempSrc: Oral Oral  Oral  SpO2: 95% (!) 52% (!) 75% (!) 83%  Weight: 115.7 kg     Height: 5\' 5"  (1.651 m)      Gen: Elderly woman, lying in bed, alert CV: RR, NR, no murmur Pulm: Breathing comfortably on room air, no wheezing Abd: Obese, +BS, mildly tender throughout MSK: She has a midline scar on the left knee, knee immobilizer lying under knee, mildly tender to palpation Skin: She has erythema of the LLE.  Warmth is improved, similar to right LE today.    Assessment/Plan:  Left tibial fracture H/O knee replacement 2019 - Pain control with hydrocodone-apap, receiving about 3 doses/day - Currently no plan for surgical correction - PT/OT recommending SNF with rehab, patient is not interested - TOC team reached out to son, and son has requested a SNF at discharge at first to help with mobilization for the patient in the immobilizer - FL2 signed 6/3 - Knee immobilizer on while walking, follow up with surgeon at [facility] recommended - Bowel regimen ordered while on narcotics  LLE cellulitis, non purulent - Warmth is improved today - Continue doxycycline for 5 day course, day 2 of 5   Mild Hyperkalemia  with Mild AKI on CKD stage 3a seen on lab work - Forensic scientist blood work - Encourage PO hydration  Hypertension - BP lower with pain medications, but improved today - Continue to hold home medications, restart as indicated - Holding lasix, losartan  Asthma, chronic allergies - Will plan to start albuterol PRN and flonase back today  GERD - Protonix - she can return to her omeprazole on discharge  DDD - Hydrocodone-apa q 4 hours prn  MDD - Continue home duloxetine  Morbid obesity Sleep apnea (wears oxygen at night) - will provide qhs oxygen  Dispo: Anticipated discharge in approximately 2-3 day(s).   Sid Falcon, MD 05/21/2022, 9:36 AM

## 2022-05-21 NOTE — TOC Progression Note (Signed)
Transition of Care [facility]) - Progression Note    Patient Details  Name: Kristine [name] MRN: [id number] Date of Birth: June 05, 1931  Transition of [facility]) CM/SW Contact  Bess Kinds, RN Phone Number: [phone] 05/21/2022, 1:37 PM  Clinical Narrative:     Spoke with patient at the bedside to discuss son's concerns about transition home. Discussed STR then transition home, but patient insistent that she will return home. Call to her son, Molly Maduro, on speakerphone. Molly Maduro will do whatever she wants and he will be there to care for her as needed. Discussed recommendations for hospital bed and possible hoyer lift. Patient stated that she has a lift chair where she spends most of her time and would prefer the chair to a hospital bed. She is on home oxygen at bed time, and stated her oxygen provider is [employer]. Discussed getting a new wheelchair stating that her previous one is in poor condition. DME order placed for wheelchair for MD review. Referral to [employer] for delivery of chair to bedside prior to discharge. Discussed HH PT/OT - patient would like [facility]. Referral accepted for PT, OT, SW, Aide - patient will need HH/Face to Face orders. Anticipate family to provide transportation home at discharge.   Expected Discharge Plan: Home w Home Health Services Barriers to Discharge: Continued Medical Work up  Expected Discharge Plan and Services Expected Discharge Plan: Home w Home Health Services In-house Referral: Clinical Social Work Discharge Planning Services: CM Consult Post Acute Care Choice: Durable Medical Equipment, Home Health Living arrangements for the past 2 months: Single Family Home                 DME Arranged: Community education officer wheelchair with seat cushion DME Agency: [employer] Date DME Agency Contacted: 05/21/22 Time DME Agency Contacted: 1331 Representative spoke with at DME Agency: [facility] Arranged: PT, OT, Nurse's Aide, Social Work [employer] Agency: [facility]  Care Date [facility] Agency Contacted: 05/21/22 Time HH Agency Contacted: 1332 Representative spoke with at [facility] Agency: Kandee Keen   Social Determinants of Health (SDOH) Interventions    Readmission Risk Interventions     View : No data to display.

## 2022-05-21 NOTE — Progress Notes (Cosign Needed)
    Durable Medical Equipment  (From admission, onward)           Start     Ordered   05/21/22 1336  For home use only DME lightweight manual wheelchair with seat cushion  Once       Comments: Patient suffers from posterior tibial tendon dysfunction which impairs their ability to perform daily activities like bathing, dressing, and toileting in the home.  A walker will not resolve  issue with performing activities of daily living. A wheelchair will allow patient to safely perform daily activities. Patient is not able to propel themselves in the home using a standard weight wheelchair due to endurance and general weakness. Patient can self propel in the lightweight wheelchair. Length of need 6 months . Accessories: elevating leg rests (ELRs), wheel locks, extensions and anti-tippers.   05/21/22 1336

## 2022-05-21 NOTE — Care Management Obs Status (Signed)
MEDICARE OBSERVATION STATUS NOTIFICATION   Patient Details  Name: Kristine [name] MRN: [id number] Date of Birth: April 05, 1931   Medicare Observation Status Notification Given:  Yes    Bess Kinds, RN 05/21/2022, 9:35 AM

## 2022-05-22 MED ORDER — IPRATROPIUM-ALBUTEROL 0.5-2.5 (3) MG/3ML IN SOLN
3.0000 mL | RESPIRATORY_TRACT | Status: DC
  Administered 2022-05-22: 3 mL via RESPIRATORY_TRACT
  Filled 2022-05-22: qty 3

## 2022-05-22 MED ORDER — SENNOSIDES-DOCUSATE SODIUM 8.6-50 MG PO TABS
2.0000 | ORAL_TABLET | Freq: Two times a day (BID) | ORAL | Status: DC
  Administered 2022-05-22 – 2022-05-26 (×7): 2 via ORAL
  Filled 2022-05-22 (×8): qty 2

## 2022-05-22 MED ORDER — ACETAMINOPHEN 500 MG PO TABS
1000.0000 mg | ORAL_TABLET | Freq: Three times a day (TID) | ORAL | Status: DC
  Administered 2022-05-22 – 2022-05-26 (×12): 1000 mg via ORAL
  Filled 2022-05-22 (×12): qty 2

## 2022-05-22 MED ORDER — OXYCODONE HCL 5 MG PO TABS
5.0000 mg | ORAL_TABLET | ORAL | Status: DC
  Administered 2022-05-22 – 2022-05-26 (×22): 5 mg via ORAL
  Filled 2022-05-22 (×23): qty 1

## 2022-05-22 MED ORDER — LIDOCAINE 5 % EX PTCH
1.0000 | MEDICATED_PATCH | CUTANEOUS | Status: DC
  Administered 2022-05-22 – 2022-05-25 (×4): 1 via TRANSDERMAL
  Filled 2022-05-22 (×4): qty 1

## 2022-05-22 NOTE — Plan of Care (Signed)

## 2022-05-22 NOTE — Progress Notes (Signed)
NAME:  Kristine [name], MRN:  [id number], DOB:  Oct 01, 1931, LOS: 0 ADMISSION DATE:  05/19/2022  Subjective  Patient evaluated at bedside this AM. Reports she was unable to work with physical therapy yesterday due to the pain. Also reports no BM in last few days. Throughout interview very tearful, kept repeating, "I will be okay."  Objective   Blood pressure 116/71, pulse 80, temperature 98.9 F (37.2 C), temperature source Oral, resp. rate 18, height 5\' 5"  (1.651 m), weight 115.7 kg, SpO2 93 %.     Intake/Output Summary (Last 24 hours) at 05/22/2022 1359 Last data filed at 05/22/2022 1034 Gross per 24 hour  Intake --  Output 1000 ml  Net -1000 ml   Filed Weights   05/19/22 1914 05/20/22 1306  Weight: 115.7 kg 115.7 kg   Physical Exam: General: Resting comfortably in no acute distress CV: Regular rate, rhythm. 2/6 systolic murmur appreciated best at LUSB. Distal pulses 2+ bilaterally. Pulm: Normal respiratory effort on room air. Mild end-expiratory wheezing appreciated.  Abdomen: Soft, mildly distended, non-tender. Normoactive bowel sounds. MSK: Swelling and ecchymoses appreciated lateral and distal to left knee. Area warm to touch. More distally, has venous stasis without open wounds. This area cool to touch. Neuro: Awake, alert, conversing appropriately. Psych: Tearful throughout, normal speech.  Labs       Latest Ref Rng & Units 05/21/2022   12:43 PM 05/20/2022    1:50 AM 05/19/2022    3:47 PM  CBC  WBC 4.0 - 10.5 K/uL 9.4   7.7   7.2    Hemoglobin 12.0 - 15.0 g/dL 07/19/2022   9.7   56.2    Hematocrit 36.0 - 46.0 % 31.6   30.9   34.3    Platelets 150 - 400 K/uL 197   190   219        Latest Ref Rng & Units 05/21/2022   12:43 PM 05/20/2022    1:50 AM 05/19/2022    3:47 PM  BMP  Glucose 70 - 99 mg/dL 07/19/2022   893   734    BUN 8 - 23 mg/dL 19   19   16     Creatinine 0.44 - 1.00 mg/dL 287     6.81    Sodium 135 - 145 mmol/L 134   139   141    Potassium 3.5 - 5.1 mmol/L 5.0   5.2    4.5    Chloride 98 - 111 mmol/L 103   107   106    CO2 22 - 32 mmol/L 22   25   28     Calcium 8.9 - 10.3 mg/dL 8.7   8.7   9.6      Summary  Kristine [name] is 88yo person with CKDIIb, HTN, history of knee replacement admitted 6/1 with left proximal tibial fracture, working on pain control today.  Assessment & Plan:  Principal Problem:   Left tibial fracture Active Problems:   Fall  #Left proximal tibial fracture #History of knee replacement 2019 This morning, patient did not have knee immobilizer on. She was unable to work with physical therapy yesterday given her significant pain. It appears she has not been able to work with therapy much since arrival. I am concerned that if we do not obtain adequate pain control, she will not work with physical therapy which will ultimately set her recovery back. In addition, Ms. Mcclune has refused SNF and is planning to go home with max services. I am  concerned she will not do well at home given the circumstances. However, she does have full capacity and we will move forward with her wishes.  - Knee immobilizer in place while not in bed - Add Tylenol 1000mg  three times daily - Add lidocaine patch over knee - Switch oxycodone 5mg  every 4 hours - IV morphine 2mg  every 4 hours as needed for breakthrough pain - Hold opioid medications & call MD if SBP<90, HR<65, RR<10, O2<90, or altered mental status. - Continue home gabapentin - Continue working with physical and occupational therapy - needs maximum Home Health services - Appreciate TOC's assistance  #Constipation Patient has not had bowel movement since arrival. Most likely due to immobilization and opioid medications. Will try to avoid enemas or suppositories given might be difficult logistically with her knee. - Increase Senokot-S 2 tablets twice daily - Continue daily Miralax  #Nonpurulent cellulitis of LLE Less erythematous, no warmth on exam this morning. Will continue with 5 day  course of antibiotics.  - Continue doxycycline 100mg  twice daily for 5d (day 3/5)  #Chronic kidney disease IIIb No lab work this morning, will repeat BMP in AM.   #Hypertension Currently normotensive, will hold home antihypertensives, especially as she is receiving pain medications.   Best practice:  DIET: Regular IVF: n/a DVT PPX: lovenox BOWEL: Senokot-S, miralax CODE: FULL FAM COM: n/a  , MD Internal Medicine Resident PGY-2 PAGER: [phone] 05/22/2022 1:59 PM  If after hours (below), please contact on-call pager: [phone] 5PM-7AM Monday-Friday 1PM-7AM Saturday-Sunday

## 2022-05-23 ENCOUNTER — Other Ambulatory Visit (HOSPITAL_COMMUNITY): Payer: Self-pay

## 2022-05-23 DIAGNOSIS — S82102A Unspecified fracture of upper end of left tibia, initial encounter for closed fracture: Secondary | ICD-10-CM | POA: Diagnosis not present

## 2022-05-23 LAB — BASIC METABOLIC PANEL
Anion gap: 4 — ABNORMAL LOW (ref 5–15)
BUN: 25 mg/dL — ABNORMAL HIGH (ref 8–23)
CO2: 29 mmol/L (ref 22–32)
Calcium: 8.7 mg/dL — ABNORMAL LOW (ref 8.9–10.3)
Chloride: 103 mmol/L (ref 98–111)
Creatinine, Ser: 1.43 mg/dL — ABNORMAL HIGH (ref 0.44–1.00)
GFR, Estimated: 35 mL/min — ABNORMAL LOW (ref >60)
Glucose, Bld: 110 mg/dL — ABNORMAL HIGH (ref 70–99)
Potassium: 5 mmol/L (ref 3.5–5.1)
Sodium: 136 mmol/L (ref 135–145)

## 2022-05-23 MED ORDER — DOXYCYCLINE HYCLATE 100 MG PO TABS
100.0000 mg | ORAL_TABLET | Freq: Two times a day (BID) | ORAL | 0 refills | Status: DC
  Filled 2022-05-23: qty 3, 2d supply, fill #0

## 2022-05-23 MED ORDER — OXYCODONE HCL 5 MG PO TABS
5.0000 mg | ORAL_TABLET | ORAL | 0 refills | Status: DC
  Filled 2022-05-23: qty 30, 5d supply, fill #0

## 2022-05-23 NOTE — Progress Notes (Addendum)
Occupational Therapy Treatment Patient Details Name: Kristine MinceDorothy [name] MRN: [id number] DOB: [birth year] Today's Date: 05/23/2022   History of present illness Pt is an 88 y/o female admitted secondary to fall. Found to have L tibia fx around TKA hardware. PMH includes R foot surgery, HTN, and CKD.   OT comments  Pt with gradual progression towards OT goals. Pt's pain appeared more controlled this AM though question if medication impaired cognition as confusion noted during session. Overall, pt able to easily complete bed mobility to sit EOB with min guard. However, pt continues to require extensive assist for OOB transfer attempts, [facility] despite [facility] x 2. Pt requires extensive assist for scooting attempt a ong bedside, as well as LB ADLs (including bed level toileting assist today). Continue to strongly recommend SNF rehab at [place] though pt reports "I have been crippled before" and endorses family ability to assist at home.    Recommendations for follow up therapy are one component of a multi-disciplinary discharge planning process, led by the attending physician.  Recommendations may be updated based on patient status, additional functional criteria and insurance authorization.    Follow Up Recommendations  Skilled nursing-short term rehab (<3 hours/day)    Assistance Recommended at Discharge Frequent or constant Supervision/Assistance  Patient can return home with the following  Two people to help with walking and/or transfers;Two people to help with bathing/dressing/bathroom;Assistance with cooking/housework;Direct supervision/assist for medications management;Direct supervision/assist for financial management;Assist for transportation;Help with stairs or ramp for entrance   Equipment Recommendations  Wheelchair cushion (measurements OT);Wheelchair (measurements OT);Hospital bed;Other (comment) (hoyer lift; appears pt declining hospital bed and  opting to sleep in lift chair)    Recommendations for Other Services      Precautions / Restrictions Precautions Precautions: Fall;Other (comment) Precaution Comments: No ROM L knee Required Braces or Orthoses: Knee Immobilizer - Left Knee Immobilizer - Left: Other (comment) (per orders, KI on "when walking"; placed on for EOB/OOB attempts with therapy today) Restrictions Weight Bearing Restrictions: Yes LLE Weight Bearing: Weight bearing as tolerated       Mobility Bed Mobility Overal bed mobility: Needs Assistance Bed Mobility: Rolling, Supine to Sit, Sit to Supine Rolling: Mod assist   Supine to sit: Min guard, HOB elevated Sit to supine: Min guard   General bed mobility comments: pt able to use bedrail to pull self forward and easily transfer to EOB. min guard to ensure L LE safety though pt able to lift back into bed with effort. Min-Mod A for rolling side to side (R>L) for peri care    Transfers Overall transfer level: Needs assistance Equipment used: 2 person hand held assist, Rolling walker (2 wheels)               General transfer comment: Attempted x 2 to stand with RW [facility] though unable to successfully complete despite Max A x 2. Guided pt in Max A x 2 scooting along [facility] with difficulty sequencing to push with UEs and difficulty clearing bottom     Balance Overall balance assessment: Needs assistance Sitting-balance support: No upper extremity supported, Feet supported Sitting balance-Leahy Scale: Good Sitting balance - Comments: able to reach overhead to brush hair sitting EOB without LOB   Standing balance support: Bilateral upper extremity supported, During functional activity Standing balance-Leahy Scale: Zero  ADL either performed or assessed with clinical judgement   ADL Overall ADL's : Needs assistance/impaired     Grooming: Set up;Brushing hair;Sitting Grooming  Details (indicate cue type and reason): sitting EOB without LOB             Lower Body Dressing: Total assistance;Bed level Lower Body Dressing Details (indicate cue type and reason): sock and KI mgmt     Toileting- Clothing Manipulation and Hygiene: Total assistance;Bed level Toileting - Clothing Manipulation Details (indicate cue type and reason): for cleanup after bedpan use       General ADL Comments: Pt able to progress to EOB easily though still unable to stand despite +2 assist. Limited by confusion s/p pain medication administration.    Extremity/Trunk Assessment Upper Extremity Assessment Upper Extremity Assessment: Overall WFL for tasks assessed   Lower Extremity Assessment Lower Extremity Assessment: Defer to PT evaluation        Vision   Vision Assessment?: No apparent visual deficits   Perception     Praxis      Cognition Arousal/Alertness: Awake/alert Behavior During Therapy: Restless, Impulsive Overall Cognitive Status: No family/caregiver present to determine baseline cognitive functioning                                 General Comments: Pt with poor short term memory, attention and awareness (feel it may be related to pain meds). pt looking for cell phone, provided phone number for OT to call (presumably pt's cell number) though son answered and reported she hasnt had her phone since being at hospital. Pt spoke to son on the phone who relayed this info, then noted to look for phone again and had forgotten previous conversation. follows one step directions, decreased awareness of current deficits and the assist needed at home. Pt reports "I've been crippled before" and that her family will help her        Exercises      Shoulder Instructions       General Comments      Pertinent Vitals/ Pain       Pain Assessment Pain Assessment: Faces Faces Pain Scale: Hurts little more Pain Location: L knee Pain Descriptors / Indicators:  Guarding, Grimacing Pain Intervention(s): Premedicated before session, Monitored during session, Limited activity within patient's tolerance  Home Living                                          Prior Functioning/Environment              Frequency  Min 2X/week        Progress Toward Goals  OT Goals(current goals can now be found in the care plan section)  Progress towards OT goals: Progressing toward goals  Acute Rehab OT Goals Patient Stated Goal: pain control, go home OT Goal Formulation: With patient Time For Goal Achievement: 06/03/22 Potential to Achieve Goals: Fair ADL Goals Pt Will Perform Lower Body Bathing: with mod assist;sitting/lateral leans;sit to/from stand Pt Will Perform Lower Body Dressing: with mod assist;sitting/lateral leans;sit to/from stand Pt Will Transfer to Toilet: with mod assist;stand pivot transfer Pt Will Perform Toileting - Clothing Manipulation and hygiene: with mod assist;sitting/lateral leans;sit to/from stand Additional ADL Goal #1: Pt will complete all aspects of bed mobility with min A. Additional ADL Goal #2: Pt will tolerate sitting  EOB for 3-5 mins to complete grooming tasks.  Plan Discharge plan remains appropriate    Co-evaluation                 AM-PAC OT "6 Clicks" Daily Activity     Outcome Measure   Help from another person eating meals?: A Little Help from another person taking care of personal grooming?: A Little Help from another person toileting, which includes using toliet, bedpan, or urinal?: Total Help from another person bathing (including washing, rinsing, drying)?: A Lot Help from another person to put on and taking off regular upper body clothing?: A Little Help from another person to put on and taking off regular lower body clothing?: Total 6 Click Score: 13    End of Session Equipment Utilized During Treatment: Left knee immobilizer  OT Visit Diagnosis: Unsteadiness on feet  (R26.81);Other abnormalities of gait and mobility (R26.89);Muscle weakness (generalized) (M62.81);Pain Pain - Right/Left: Left Pain - part of body: Leg   Activity Tolerance Patient tolerated treatment well   Patient Left in bed;with call bell/phone within reach;with bed alarm set   Nurse Communication Mobility status;Need for lift equipment        Time: [phone] OT Time Calculation (min): 31 min  Charges: OT General Charges $OT Visit: 1 Visit OT Treatments $Self Care/Home Management : 8-22 mins  Bradd Canary, OTR/L Acute Rehab Services Office: [phone]   Kristine [name] 05/23/2022, 10:13 AM

## 2022-05-23 NOTE — Discharge Instructions (Addendum)
Kristine [name],   It was a pleasure to care for you during your stay at [facility] were admitted because you broke one of the bones that makes up your knee joint. You will receive home health assistance when you go home and you will need to follow-up with the surgeon that performed your knee replacement.   You will have two new prescriptions when you go home:  1. Doxycyline: This is an antibiotic for the skin infection on the front of your left leg. You will take one tablet tonight before bed, then take one at breakfast tomorrow and one at bedtime tomorrow. 2. Oxycodone: You can take this every 4 hours as needed for pain. You should also use the lidocaine patch, and can apply heat or ice as well.  Please do not take your home Norco if you are taking the oxycodone I have prescribed you for your knee/leg pain.   Remember to wear your knee immobilizer anytime you are out of bed.   My best, Dr. Marlou Sa

## 2022-05-23 NOTE — TOC Progression Note (Addendum)
Transition of Care [facility]) - Progression Note    Patient Details  Name: Kristine [name] MRN: [id number] Date of Birth: June 10, 1931  [facility] CM/SW Contact  Bess Kinds, RN Phone Number: [phone] 05/23/2022, 3:11 PM  Clinical Narrative:     Spoke with patient's nephew/POA, Vikki Ports, concerning post acute transition to home with [facility] vs SNF. Discussed bed offers. Received bed offer from [facility] for Wednesday. Patient and Truddie Coco accepted. TOC to seek insurance authorization.   Update: Patient's [employer] is not managed by [employer]. [employer] to seek authorization.   Expected Discharge Plan: Skilled Nursing Facility Barriers to Discharge: Continued Medical Work up  Expected Discharge Plan and Services Expected Discharge Plan: Skilled Nursing Facility In-house Referral: Clinical Social Work Discharge Planning Services: CM Consult Post Acute Care Choice: Durable Medical Equipment, Home Health Living arrangements for the past 2 months: Single Family Home Expected Discharge Date: 05/23/22               DME Arranged: Community education officer wheelchair with seat cushion DME Agency: [employer] Date DME Agency Contacted: 05/21/22 Time DME Agency Contacted: 1331 Representative spoke with at DME Agency: [facility] HH Arranged: PT, OT, Nurse's Aide, Social Work [employer] Agency: [facility] Date [facility] Agency Contacted: 05/21/22 Time HH Agency Contacted: 1332 Representative spoke with at [facility] Agency: Kandee Keen   Social Determinants of Health (SDOH) Interventions    Readmission Risk Interventions     View : No data to display.

## 2022-05-23 NOTE — Discharge Summary (Addendum)
Name: Sanne Brazelton MRN: [id number] DOB: 1931/04/17 88 y.o. PCP: Pcp, No  Date of Admission: 05/19/2022  3:31 PM Date of Discharge:  05/26/2022 Attending Physician: Dr. Daryll Drown  DISCHARGE DIAGNOSIS:  Primary Problem: Left tibial fracture   Hospital Problems: Principal Problem:   Left tibial fracture Active Problems:   Fall    DISCHARGE MEDICATIONS:   Allergies as of 05/26/2022       Reactions   Iron Nausea And Vomiting   Other reaction(s): GI Upset (intolerance)   Advair Diskus [fluticasone-salmeterol] Other (See Comments)   HR and BP go up   Clavulanic Acid    Relafen [nabumetone] Nausea Only   Amoxicillin [amoxicillin] Rash        Medication List     STOP taking these medications    HYDROcodone-acetaminophen 5-325 MG tablet Commonly known as: NORCO/VICODIN       TAKE these medications    acetaminophen 500 MG tablet Commonly known as: TYLENOL Take 1,000 mg by mouth every 4 (four) hours as needed (Arthritis pain).   albuterol 108 (90 Base) MCG/ACT inhaler Commonly known as: VENTOLIN HFA Inhale 2 puffs into the lungs every 6 (six) hours as needed for wheezing or shortness of breath.   aspirin 81 MG chewable tablet Chew 1 tablet (81 mg total) by mouth daily for 14 days. Start taking on: May 27, 2022   Calcium Carb-Cholecalciferol [id number] MG-UNIT Caps Take 1 tablet by mouth 2 (two) times daily.   DULoxetine 60 MG capsule Commonly known as: CYMBALTA Take 60 mg by mouth daily.   fluticasone 50 MCG/ACT nasal spray Commonly known as: FLONASE Place 1 spray into both nostrils daily as needed for congestion.   furosemide 20 MG tablet Commonly known as: LASIX Take 20 mg by mouth every other day.   gabapentin 300 MG capsule Commonly known as: NEURONTIN Take 600-900 mg by mouth See admin instructions. Take 600 mg in the morning and 900 mg at bedtime   GLUCOSAMINE COMPLEX PO Take 2,000 mg by mouth 2 (two) times daily.   ipratropium-albuterol  0.5-2.5 (3) MG/3ML Soln Commonly known as: DUONEB Take 3 mLs by nebulization daily as needed (Congestion).   losartan 25 MG tablet Commonly known as: COZAAR Take 25 mg by mouth daily.   meclizine 25 MG tablet Commonly known as: ANTIVERT Take 25 mg by mouth daily as needed for dizziness.   montelukast 10 MG tablet Commonly known as: SINGULAIR Take 10 mg by mouth at bedtime.   OMEGA 3-6-9 FATTY ACIDS PO Take 1 capsule by mouth 2 (two) times daily.   omeprazole 40 MG capsule Commonly known as: PRILOSEC Take 40 mg by mouth every morning.   oxyCODONE 5 MG immediate release tablet Commonly known as: Oxy IR/ROXICODONE Take 1 tablet (5 mg total) by mouth every 4 (four) hours.   OXYGEN Inhale 2 L into the lungs daily as needed (Shortness of breath).   polyethylene glycol powder 17 GM/SCOOP powder Commonly known as: GLYCOLAX/MIRALAX Take 17 g by mouth daily as needed for moderate constipation or mild constipation.   simvastatin 40 MG tablet Commonly known as: ZOCOR Take 40 mg by mouth at bedtime.   Turmeric 500 MG Caps Take 500 mg by mouth 2 (two) times daily.               Durable Medical Equipment  (From admission, onward)           Start     Ordered   05/21/22 1336  For home use only  DME lightweight manual wheelchair with seat cushion  Once       Comments: Patient suffers from posterior tibial tendon dysfunction which impairs their ability to perform daily activities like bathing, dressing, and toileting in the home.  A walker will not resolve  issue with performing activities of daily living. A wheelchair will allow patient to safely perform daily activities. Patient is not able to propel themselves in the home using a standard weight wheelchair due to endurance and general weakness. Patient can self propel in the lightweight wheelchair. Length of need 6 months . Accessories: elevating leg rests (ELRs), wheel locks, extensions and anti-tippers.   05/21/22 1336             DISPOSITION AND FOLLOW-UP:  Ms.Sherlin Peary was discharged from [facility] in Stable condition. At the hospital follow up visit please address:  Left proximal tibial fracture 2/2 mechanical fall  History of knee replacement, 2019 Ensure patient has been able to follow-up with orthopedic surgeon who did her knee replacement in 2019.  Nonpurulent cellulitis of LLE Monitor for continued improvement of cellulitis.  Chronic kidney disease, stage IIIb Recheck BMP.  Follow-up Recommendations: Consults: Orthopedic surgery Labs: Basic Metabolic Profile Studies: None Medications:   START taking these medications: Oxycodone 5 mg PO q4h PRN pain Aspirin once daily for 14 days. Do not start taking lasix and losartan again until you see your PCP. No other medication changes.  Follow-up Appointments:  Follow-up Information     [facility] Follow up.   Specialty: [place] Why: someone from the office will call to schedule home health visits Contact information: [place] [address] [phone]         Avie Echevaria., MD. Schedule an appointment as soon as possible for a visit in 1 week(s).   Specialty: Sports Medicine Why: Please call and make an appointment to be seen in the next 1 week. Contact information: [address] [place] [address] [place]:  Patient Summary: Left proximal tibial fracture 2/2 mechanical fall  History of knee replacement, 2019 Patient presented with 10/10 L knee pain after mechanical fall, had prior L knee replacement. XR with acute tibial fracture around the prosthetic, prosthetic remains intact. Ortho consulted in ED, recommended knee immobilizer and weightbearing as tolerated. Pain was managed with oxycodone, tylenol, lidocaine patches, and IV morphine PRN. PT/OT evaluated patient and  recommended SNF at discharge which patient initially declined in favor of max HH services. She ultimately decided to go to SNF at discharge.  Nonpurulent cellulitis of LLE L anterior shin with erythematous, warm, and well demarcated area. Patient received a 5-day course of doxycycline during her admission.  Chronic kidney disease, stage IIIb Noted on labs throughout admission, stable. Day of discharge BUN/Cr 25/1.41, GFR 36.  Peripheral Neuropathy  Continued home gabapentin.  HTN  Home lasix, losartan were held. Patient was normotensive throughout admission.   HLD  Continued home simvastatin.   Depression  Continued home Cymbalta.    DISCHARGE INSTRUCTIONS:   Ms. Bedel,  It was a pleasure to care for you during your stay at [facility] were admitted because you broke one of the bones that makes up your knee joint. You will receive home health assistance when you go home and you will need to follow-up with the surgeon that  performed your knee replacement.  You will have two new prescriptions when you go home:  1. Oxycodone: You can take this every 4 hours as needed for pain. You should also use the lidocaine patch, and can apply heat or ice as well. Do not take your Norco prescription while taking this medication. 2. Aspirin: Take this once daily for two weeks. This is to help decrease your risk of blood clots.  Remember to wear your knee immobilizer anytime you are out of bed.  My best, Dr. Marlou Sa  SUBJECTIVE:  Patient is resting comfortably in bed, looks comfortable and perks up when our team walks into her room. She states that she is not having any pain at this time and moves her LLE freely to show her progress. She is eager to get her strength up at the SNF that she is discharging to.  Discharge Vitals:   BP 125/89 (BP Location: Left Arm)   Pulse 77   Temp 98.1 F (36.7 C)   Resp 16   Ht 5\' 5"  (1.651 m)   Wt 115.7 kg   LMP  (LMP Unknown)   SpO2 92%    BMI 42.45 kg/m   OBJECTIVE:  Constitutional:Elderly female resting comfortably in bed, in no acute distress. Pulm:Normal work of breathing on room air. [id number] all 4 extremities spontaneously. Skin:Persistence of erythema and a circular area of pink, healed-appearing skin proximal to L ankle on anterior surface. Increased warmth has nearly resolved. Bruising from her fall present on lateral distal LLE with minimal tenderness over the area and mild edema. She tolerates palpation of the extremity. Neuro:Alert and oriented x3. No focal deficit noted. Psych:Pleasant mood and affect.  Pertinent Labs, Studies, and Procedures:     Latest Ref Rng & Units 05/25/2022    3:03 AM 05/21/2022   12:43 PM 05/20/2022    1:50 AM  CBC  WBC 4.0 - 10.5 K/uL 6.0  9.4  7.7   Hemoglobin 12.0 - 15.0 g/dL 8.7  10.3  9.7   Hematocrit 36.0 - 46.0 % 27.2  31.6  30.9   Platelets 150 - 400 K/uL 197  197  190        Latest Ref Rng & Units 05/26/2022    2:26 AM 05/25/2022    3:03 AM 05/23/2022    1:48 AM  CMP  Glucose 70 - 99 mg/dL 112  95  110   BUN 8 - 23 mg/dL 25  27  25    Creatinine 0.44 - 1.00 mg/dL 1.41  1.32  1.43   Sodium 135 - 145 mmol/L 137  137  136   Potassium 3.5 - 5.1 mmol/L 4.2  4.9  5.0   Chloride 98 - 111 mmol/L 101  100  103   CO2 22 - 32 mmol/L 28  31  29    Calcium 8.9 - 10.3 mg/dL 9.1  9.1  8.7     DG Tibia/Fibula Left  Result Date: 05/19/2022 CLINICAL DATA:  Left knee and lower leg pain.  Fall. EXAM: LEFT KNEE - COMPLETE 4+ VIEW; LEFT TIBIA AND FIBULA - 2 VIEW COMPARISON:  Frontal view of the bilateral knees 10/07/2021 FINDINGS: Left knee: There is diffuse decreased bone mineralization. There is oblique linear lucency extending from the tibial tubercle posterior inferiorly to the anterior distal aspect of the tibial stem. Additional oblique linear lucency overlying the proximal posterior tibia, posterior to the distal aspect of the tibial stem. Corresponding lucency overlying the medial proximal  tibial metadiaphysis and lateral  proximal tibial metaphysis on frontal view. This is highly suspicious for a periprosthetic fracture. No periprosthetic fracture is seen within the distal femur. No definite joint effusion. IMPRESSION: Nondisplaced, acute, periprosthetic fracture of the proximal tibial metadiaphysis. Electronically Signed   By: Yvonne Kendall M.D.   On: 05/19/2022 16:42   DG Knee Complete 4 Views Left  Result Date: 05/19/2022 CLINICAL DATA:  Left knee and lower leg pain.  Fall. EXAM: LEFT KNEE - COMPLETE 4+ VIEW; LEFT TIBIA AND FIBULA - 2 VIEW COMPARISON:  Frontal view of the bilateral knees 10/07/2021 FINDINGS: Left knee: There is diffuse decreased bone mineralization. There is oblique linear lucency extending from the tibial tubercle posterior inferiorly to the anterior distal aspect of the tibial stem. Additional oblique linear lucency overlying the proximal posterior tibia, posterior to the distal aspect of the tibial stem. Corresponding lucency overlying the medial proximal tibial metadiaphysis and lateral proximal tibial metaphysis on frontal view. This is highly suspicious for a periprosthetic fracture. No periprosthetic fracture is seen within the distal femur. No definite joint effusion. IMPRESSION: Nondisplaced, acute, periprosthetic fracture of the proximal tibial metadiaphysis. Electronically Signed   By: Yvonne Kendall M.D.   On: 05/19/2022 16:42     Signed: Farrel Gordon, D.O.  Internal Medicine Resident, PGY-1 [facility]  Pager: [phone] 8:30 AM, 05/26/2022

## 2022-05-23 NOTE — Progress Notes (Signed)
Physical Therapy Treatment Patient Details Name: Kristine [name] MRN: [id number] DOB: [birth year] Today's Date: 05/23/2022   History of Present Illness Pt is an 88 y/o female admitted secondary to fall. Found to have L tibia fx around TKA hardware. PMH includes R foot surgery, HTN, and CKD.    PT Comments    Pt received supine in bed with HOB elevated for session in conjunction with OT for focus on EOB/OOB mobility. Pt pain more controlled this session though still continues to be limiting factor in progression of OOB tolerance. Pt able to perform bed mobility with min guard to come to sitting EOB. Pt required max-total assist +2 to attempt transfer OOB to [facility] without success as pt unable to tolerate coming to stand or partial stand secondary to pain. Pt able to scoot toward [facility] with max assist of 2 with small incremental movements and inability to fully clear hips from EOB. Current plan remains appropriate to address deficits and maximize functional independence and decrease caregiver burden. Pt continues to benefit from skilled PT services to progress toward functional mobility goals.     Recommendations for follow up therapy are one component of a multi-disciplinary discharge planning process, led by the attending physician.  Recommendations may be updated based on patient status, additional functional criteria and insurance authorization.  Follow Up Recommendations  Skilled nursing-short term rehab (<3 hours/day)     Assistance Recommended at Discharge Frequent or constant Supervision/Assistance  Patient can return home with the following Two people to help with walking and/or transfers;Two people to help with bathing/dressing/bathroom;Assistance with cooking/housework;Help with stairs or ramp for entrance;Assist for transportation   Equipment Recommendations  Hospital bed;Other (comment)    Recommendations for Other Services       Precautions / Restrictions Precautions Precautions:  Fall;Other (comment) Precaution Comments: No ROM L knee Required Braces or Orthoses: Knee Immobilizer - Left Knee Immobilizer - Left: Other (comment) (per orders, KI on "when walking"; placed on for EOB/OOB attempts with therapy today) Restrictions Weight Bearing Restrictions: Yes LLE Weight Bearing: Weight bearing as tolerated     Mobility  Bed Mobility Overal bed mobility: Needs Assistance Bed Mobility: Rolling, Supine to Sit, Sit to Supine Rolling: Mod assist   Supine to sit: Min guard, HOB elevated Sit to supine: Min guard   General bed mobility comments: pt able to use bedrail to pull self forward and easily transfer to EOB. min guard to ensure L LE safety though pt able to lift back into bed with effort. Min-Mod A for rolling side to side (R>L) for peri care    Transfers Overall transfer level: Needs assistance Equipment used: 2 person hand held assist, Rolling walker (2 wheels)               General transfer comment: Attempted x 2 to stand with RW in hopes to transfer to [facility] though unable to successfully complete despite Max A x 2. Guided pt in Max A x 2 scooting along bedside towards [facility] with difficulty sequencing to push with UEs and difficulty clearing bottom    Ambulation/Gait               General Gait Details: unable   Stairs             Wheelchair Mobility    Modified Rankin (Stroke Patients Only)       Balance Overall balance assessment: Needs assistance Sitting-balance support: No upper extremity supported, Feet supported Sitting balance-Leahy Scale: Good Sitting balance - Comments: able to  reach overhead to brush hair sitting EOB without LOB   Standing balance support: Bilateral upper extremity supported, During functional activity Standing balance-Leahy Scale: Zero                              Cognition Arousal/Alertness: Awake/alert Behavior During Therapy: Restless, Impulsive Overall Cognitive Status: No  family/caregiver present to determine baseline cognitive functioning                                 General Comments: Pt with poor short term memory, attention and awareness (feel it may be related to pain meds). pt looking for cell phone, provided phone number for OT to call (presumably pt's cell number) though son answered and reported she hasnt had her phone since being at hospital. Pt spoke to son on the phone who relayed this info, then noted to look for phone again and had forgotten previous conversation. follows one step directions, decreased awareness of current deficits and the assist needed at home. Pt reports "I've been crippled before" and that her family will help her        Exercises      General Comments        Pertinent Vitals/Pain Pain Assessment Pain Assessment: Faces Faces Pain Scale: Hurts little more Pain Location: L knee Pain Descriptors / Indicators: Guarding, Grimacing Pain Intervention(s): Premedicated before session, Monitored during session, Limited activity within patient's tolerance    Home Living                          Prior Function            PT Goals (current goals can now be found in the care plan section) Acute Rehab PT Goals PT Goal Formulation: With patient Time For Goal Achievement: 06/03/22    Frequency    Min 3X/week      PT Plan      Co-evaluation PT/OT/SLP Co-Evaluation/Treatment: Yes Reason for Co-Treatment: For patient/therapist safety;To address functional/ADL transfers PT goals addressed during session: Mobility/safety with mobility        AM-PAC PT "6 Clicks" Mobility   Outcome Measure  Help needed turning from your back to your side while in a flat bed without using bedrails?: A Lot Help needed moving from lying on your back to sitting on the side of a flat bed without using bedrails?: A Lot Help needed moving to and from a bed to a chair (including a wheelchair)?: Total Help needed  standing up from a chair using your arms (e.g., wheelchair or bedside chair)?: Total Help needed to walk in hospital room?: Total Help needed climbing 3-5 steps with a railing? : Total 6 Click Score: 8    End of Session Equipment Utilized During Treatment: Left knee immobilizer;Gait belt Activity Tolerance: Patient tolerated treatment well Patient left: in bed;with call bell/phone within reach;with bed alarm set Nurse Communication: Mobility status PT Visit Diagnosis: Other abnormalities of gait and mobility (R26.89);Unsteadiness on feet (R26.81);Muscle weakness (generalized) (M62.81);Difficulty in walking, not elsewhere classified (R26.2);Pain Pain - Right/Left: Left Pain - part of body: Knee     Time: 0919-0950 PT Time Calculation (min) (ACUTE ONLY): 31 min  Charges:  $Therapeutic Activity: 8-22 mins                     Lynnmarie Lovett R. PTA Acute  Rehabilitation Services Office: [phone]   Catalina Antigua 05/23/2022, 10:39 AM

## 2022-05-23 NOTE — Discharge Summary (Incomplete Revision)
Name: Kristine [name] MRN: [id number] DOB: 1931-08-21 88 y.o. PCP: Pcp, No  Date of Admission: 05/19/2022  3:31 PM Date of Discharge:  05/23/2022 Attending Physician: Dr. Daryll Drown  DISCHARGE DIAGNOSIS:  Primary Problem: Left tibial fracture   Hospital Problems: Principal Problem:   Left tibial fracture Active Problems:   Fall    DISCHARGE MEDICATIONS:   Allergies as of 05/23/2022       Reactions   Iron Nausea And Vomiting   Other reaction(s): GI Upset (intolerance)   Advair Diskus [fluticasone-salmeterol] Other (See Comments)   HR and BP go up   Clavulanic Acid    Relafen [nabumetone] Nausea Only   Amoxicillin [amoxicillin] Rash        Medication List     TAKE these medications    acetaminophen 500 MG tablet Commonly known as: TYLENOL Take 1,000 mg by mouth every 4 (four) hours as needed (Arthritis pain).   albuterol 108 (90 Base) MCG/ACT inhaler Commonly known as: VENTOLIN HFA Inhale 2 puffs into the lungs every 6 (six) hours as needed for wheezing or shortness of breath.   Calcium Carb-Cholecalciferol [phone] MG-UNIT Caps Take 1 tablet by mouth 2 (two) times daily.   doxycycline 100 MG tablet Commonly known as: VIBRA-TABS Take 1 tablet (100 mg total) by mouth every 12 (twelve) hours.   DULoxetine 60 MG capsule Commonly known as: CYMBALTA Take 60 mg by mouth daily.   fluticasone 50 MCG/ACT nasal spray Commonly known as: FLONASE Place 1 spray into both nostrils daily as needed for congestion.   furosemide 20 MG tablet Commonly known as: LASIX Take 20 mg by mouth every other day.   gabapentin 300 MG capsule Commonly known as: NEURONTIN Take 600-900 mg by mouth See admin instructions. Take 600 mg in the morning and 900 mg at bedtime   GLUCOSAMINE COMPLEX PO Take 2,000 mg by mouth 2 (two) times daily.   HYDROcodone-acetaminophen 5-325 MG tablet Commonly known as: NORCO/VICODIN Take 1 tablet by mouth every 4 (four) hours as needed. What  changed: reasons to take this   ipratropium-albuterol 0.5-2.5 (3) MG/3ML Soln Commonly known as: DUONEB Take 3 mLs by nebulization daily as needed (Congestion).   losartan 25 MG tablet Commonly known as: COZAAR Take 25 mg by mouth daily.   meclizine 25 MG tablet Commonly known as: ANTIVERT Take 25 mg by mouth daily as needed for dizziness.   montelukast 10 MG tablet Commonly known as: SINGULAIR Take 10 mg by mouth at bedtime.   OMEGA 3-6-9 FATTY ACIDS PO Take 1 capsule by mouth 2 (two) times daily.   omeprazole 40 MG capsule Commonly known as: PRILOSEC Take 40 mg by mouth every morning.   oxyCODONE 5 MG immediate release tablet Commonly known as: Oxy IR/ROXICODONE Take 1 tablet (5 mg total) by mouth every 4 (four) hours.   OXYGEN Inhale 2 L into the lungs daily as needed (Shortness of breath).   polyethylene glycol powder 17 GM/SCOOP powder Commonly known as: GLYCOLAX/MIRALAX Take 17 g by mouth daily as needed for moderate constipation or mild constipation.   simvastatin 40 MG tablet Commonly known as: ZOCOR Take 40 mg by mouth at bedtime.   Turmeric 500 MG Caps Take 500 mg by mouth 2 (two) times daily.               Durable Medical Equipment  (From admission, onward)           Start     Ordered   05/21/22 1336  For  home use only DME lightweight manual wheelchair with seat cushion  Once       Comments: Patient suffers from posterior tibial tendon dysfunction which impairs their ability to perform daily activities like bathing, dressing, and toileting in the home.  A walker will not resolve  issue with performing activities of daily living. A wheelchair will allow patient to safely perform daily activities. Patient is not able to propel themselves in the home using a standard weight wheelchair due to endurance and general weakness. Patient can self propel in the lightweight wheelchair. Length of need 6 months . Accessories: elevating leg rests (ELRs),  wheel locks, extensions and anti-tippers.   05/21/22 1336            DISPOSITION AND FOLLOW-UP:  Kristine [name] was discharged from [facility] in Stable condition. At the hospital follow up visit please address:  Left proximal tibial fracture 2/2 mechanical fall  History of knee replacement, 2019 Ensure patient has been able to follow-up with orthopedic surgeon who did her knee replacement in 2019. Confirm that she is actively receiving home health services.  Nonpurulent cellulitis of LLE Monitor for continued improvement of cellulitis. Ensure she completed full course of doxycycline.   Chronic kidney disease, stage IIIb Recheck BMP.  Follow-up Recommendations: Consults: Orthopedic surgery Labs: Basic Metabolic Profile Studies: None Medications:   START taking these medications: Doxycycline: 1 tablet at bedtime today, then one tablet at breakfast and bedtime tomorrow 06/06. Oxycodone 5 mg PO q4h PRN pain Start taking lasix and losartan again. No other medication changes.  Follow-up Appointments:  Follow-up Information     [facility] Follow up.   Specialty: [place] Why: someone from the office will call to schedule home health visits Contact information: [place] [address] [phone]         Avie Echevaria., MD. Schedule an appointment as soon as possible for a visit in 1 week(s).   Specialty: Sports Medicine Why: Please call and make an appointment to be seen in the next 1 week. Contact information: [address] [place] [address] [employer]:  Patient Summary: Left proximal tibial fracture 2/2 mechanical fall  History of knee replacement, 2019 Patient presented with 10/10 L knee pain after mechanical fall, had prior L knee replacement. XR with acute tibial fracture around the prosthetic, prosthetic remains  intact. Ortho consulted in ED, recommended knee immobilizer and weightbearing as tolerated. Pain was managed with oxycodone, tylenol, lidocaine patches, and IV morphine PRN. PT/OT evaluated patient and recommended SNF at discharge which patient declined in favor of max HH services. At discharge, the patient's circumstances regarding safety at home are a concern however she insists that home is a safe option to return to.  Nonpurulent cellulitis of LLE L anterior shin with erythematous, warm, and well demarcated area. Patient received doxycycline throughout admission with one additional day prescribed at discharge.  Chronic kidney disease, stage IIIb Noted on labs throughout admission, stable. Day of discharge BUN/Cr 19/1.26, GFR 35.  Peripheral Neuropathy  Continued home gabapentin.  HTN  Home lasix, losartan were held. Patient was normotensive throughout admission.   HLD  Continued home simvastatin.   Depression  Continued home Cymbalta.    DISCHARGE INSTRUCTIONS:   Discharge Instructions     Call MD for:  severe uncontrolled pain   Complete  by: As directed    Discharge instructions   Complete by: As directed    Kristine [name],  It was a pleasure to care for you during your stay at [facility] were admitted because you broke one of the bones that makes up your knee joint. You will receive home health assistance when you go home and you will need to follow-up with the surgeon that performed your knee replacement.  You will have two new prescriptions when you go home:  1. Doxycyline: This is an antibiotic for the skin infection on the front of your left leg. You will take one tablet tonight before bed, then take one at breakfast tomorrow and one at bedtime tomorrow. 2. Oxycodone: You can take this every 4 hours as needed for pain. You should also use the lidocaine patch, and can apply heat or ice as well.  Remember to wear your knee immobilizer anytime you are out of  bed.  My best, Dr. Marlou Sa       SUBJECTIVE:  Patient is resting comfortably in bed, looks comfortable and perks up when our team walks into her room. She states that she is not having any pain at this time and moves her LLE freely to show her progress. She has not had a BM at this time however. She states that discharge to home rather than SNF is still her wish and that she will be safe at home.   Discharge Vitals:   BP 117/85 (BP Location: Left Arm)   Pulse 80   Temp 97.6 F (36.4 C) (Oral)   Resp 16   Ht 5\' 5"  (1.651 m)   Wt 115.7 kg   LMP  (LMP Unknown)   SpO2 91%   BMI 42.45 kg/m   OBJECTIVE:  Constitutional: Elderly  female resting comfortably in bed. In no distress. Pulm:Normal work of breathing on room air. MSK:LLE with erythema over anterior shin and increased warmth. Bruising noted distally to L knee on lateral aspect of limb. Patient moves LLE without obvious pain. Tolerates palpation of knee. Skin:Skin is warm and dry. Neuro:Alert and oriented, no focal deficit noted. Psych:Pleasant mood and affect.   Pertinent Labs, Studies, and Procedures:     Latest Ref Rng & Units 05/21/2022   12:43 PM 05/20/2022    1:50 AM 05/19/2022    3:47 PM  CBC  WBC 4.0 - 10.5 K/uL 9.4   7.7   7.2    Hemoglobin 12.0 - 15.0 g/dL 10.3   9.7   11.1    Hematocrit 36.0 - 46.0 % 31.6   30.9   34.3    Platelets 150 - 400 K/uL 197   190   219         Latest Ref Rng & Units 05/23/2022    1:48 AM 05/21/2022   12:43 PM 05/20/2022    1:50 AM  CMP  Glucose 70 - 99 mg/dL 110   106   126    BUN 8 - 23 mg/dL 25   19   19     Creatinine 0.44 - 1.00 mg/dL 1.43   1.26   1.57    Sodium 135 - 145 mmol/L 136   134   139    Potassium 3.5 - 5.1 mmol/L 5.0   5.0   5.2    Chloride 98 - 111 mmol/L 103   103   107    CO2 22 - 32 mmol/L 29   22   25  Calcium 8.9 - 10.3 mg/dL 8.7   8.7   8.7      DG Tibia/Fibula Left  Result Date: 05/19/2022 CLINICAL DATA:  Left knee and lower leg pain.  Fall. EXAM: LEFT KNEE  - COMPLETE 4+ VIEW; LEFT TIBIA AND FIBULA - 2 VIEW COMPARISON:  Frontal view of the bilateral knees 10/07/2021 FINDINGS: Left knee: There is diffuse decreased bone mineralization. There is oblique linear lucency extending from the tibial tubercle posterior inferiorly to the anterior distal aspect of the tibial stem. Additional oblique linear lucency overlying the proximal posterior tibia, posterior to the distal aspect of the tibial stem. Corresponding lucency overlying the medial proximal tibial metadiaphysis and lateral proximal tibial metaphysis on frontal view. This is highly suspicious for a periprosthetic fracture. No periprosthetic fracture is seen within the distal femur. No definite joint effusion. IMPRESSION: Nondisplaced, acute, periprosthetic fracture of the proximal tibial metadiaphysis. Electronically Signed   By: Yvonne Kendall M.D.   On: 05/19/2022 16:42   DG Knee Complete 4 Views Left  Result Date: 05/19/2022 CLINICAL DATA:  Left knee and lower leg pain.  Fall. EXAM: LEFT KNEE - COMPLETE 4+ VIEW; LEFT TIBIA AND FIBULA - 2 VIEW COMPARISON:  Frontal view of the bilateral knees 10/07/2021 FINDINGS: Left knee: There is diffuse decreased bone mineralization. There is oblique linear lucency extending from the tibial tubercle posterior inferiorly to the anterior distal aspect of the tibial stem. Additional oblique linear lucency overlying the proximal posterior tibia, posterior to the distal aspect of the tibial stem. Corresponding lucency overlying the medial proximal tibial metadiaphysis and lateral proximal tibial metaphysis on frontal view. This is highly suspicious for a periprosthetic fracture. No periprosthetic fracture is seen within the distal femur. No definite joint effusion. IMPRESSION: Nondisplaced, acute, periprosthetic fracture of the proximal tibial metadiaphysis. Electronically Signed   By: Yvonne Kendall M.D.   On: 05/19/2022 16:42     Signed: Farrel Gordon, D.O.  Internal Medicine  Resident, PGY-1 [facility]  Pager: [phone] 9:58 AM, 05/23/2022

## 2022-05-23 NOTE — Progress Notes (Signed)
HD#0 SUBJECTIVE:  Patient Summary: Kristine [name] is a 88 y.o. female with a history of HTN, HLD, GERD, OSA with nocturnal hypoxemia on 2 L O2 nightly, arthritis, and osteoporosis who presented after a mechanical fall, admitted for proximal tibial fracture.   Overnight Events: None  Interim History: Patient is resting comfortably in bed, looks comfortable and perks up when our team walks into her room. She states that she is not having any pain at this time and moves her LLE freely to show her progress. She has not had a BM at this time however. She initially states that discharge to home rather than SNF is still her wish and that she will be safe at home, however after talking with her nephew has agreed to a SNF bed at [facility] on Wednesday.   OBJECTIVE:  Vital Signs: Vitals:   05/22/22 2011 05/23/22 0500 05/23/22 0759 05/23/22 1323  BP: 104/69 126/90 117/85 106/71  Pulse: 93 74 80 82  Resp: 15 18 16    Temp: 98.3 F (36.8 C) 98.6 F (37 C) 97.6 F (36.4 C) 97.8 F (36.6 C)  TempSrc:  Oral Oral Oral  SpO2: (!) 89% 100% 91% 95%  Weight:      Height:       Supplemental O2: Room Air SpO2: 95 % O2 Flow Rate (L/min): 2 L/min FiO2 (%): (!) 0 %  Filed Weights   05/19/22 1914 05/20/22 1306  Weight: 115.7 kg 115.7 kg     Intake/Output Summary (Last 24 hours) at 05/23/2022 1545 Last data filed at 05/22/2022 1724 Gross per 24 hour  Intake --  Output 400 ml  Net -400 ml   Net IO Since Admission: -1,500 mL [05/23/22 1545]  Physical Exam: Constitutional: Elderly  female resting comfortably in bed. In no distress. Pulm:Normal work of breathing on room air. MSK:LLE with erythema over anterior shin and increased warmth. Bruising noted distally to L knee on lateral aspect of limb. Patient moves LLE without obvious pain. Tolerates palpation of knee. Skin:Skin is warm and dry. Neuro:Alert and oriented, no focal deficit noted. Psych:Pleasant mood and affect.   Patient  Lines/Drains/Airways Status     Active Line/Drains/Airways     Name Placement date Placement time Site Days   Peripheral IV 05/20/22 20 G 2.5" Anterior;Proximal;Right;Upper Arm 05/20/22  2208  Arm  3   External Urinary Catheter 05/20/22  0154  --  3   Incision (Closed) 02/18/22 Foot Right 02/18/22  1045  -- 94             ASSESSMENT/PLAN:  Assessment: Principal Problem:   Left tibial fracture Active Problems:   Fall   Plan: #Left proximal tibial fracture #History of knee replacement 2019 Patient was not wearing her knee immobilizer this morning however she was resting in bed; we advised that if she is to get out of bed, she needs to wear this. She had significant pain while trying to stand to get dressed earlier in the afternoon and again had trouble working with PT and OT, who reiterated their recommendation that she discharge to SNF. Initially today she was refusing to go to SNF at discharge however after speaking with her nephew she agreed to accept a bed offer for 2020 available Wednesday. -Continue use of knee immobilizer when out of bed -Continue tylenol 1000 mg TID -Lidocaine patch for knee -Continue oxycodone 5 mg q4h -Continue IV morphine 2 mg q4h PRN breakthrough pain -Hold opioid medications & call MD if SBP<90, HR<65,  RR<10, O2<90, or altered mental status. -Continue home gabapentin -Appreciate TOC's assistance in finding safe placement   #Constipation Patient has not had bowel movement since arrival, likely due to combination of opioid medication and immobilization. -Continue Senokot-S 2 tablets twice daily -Continue daily Miralax -Soap suds enema for one dose   #Nonpurulent cellulitis of LLE Cellulitis continues to show improvement with antibiotic therapy.Today is day 4/5 of therapy. -Continue doxycycline 100mg  twice daily for 5 days, last day 06/06   #Chronic kidney disease IIIb Renal function stable on morning labs. -Trend BMP\    #Hypertension Patient is normotensive at this time, in the setting of holding home antihypertensives. -Continue to hold home antihypertensives    Best Practice: Diet: Regular diet IVF: None VTE: enoxaparin (LOVENOX) injection 40 mg Start: 05/19/22 1800 Code: DNR AB: Doxycycline 100 mg BID, day 4/5 Therapy Recs: SNF DISPO: Anticipated discharge in 2 days to Skilled nursing facility pending  bed availability .  Signature: 07/19/22, D.O.  Internal Medicine Resident, PGY-1 [facility]  Pager: [phone] 3:45 PM, 05/23/2022   Please contact the on call pager after 5 pm and on weekends at [phone].

## 2022-05-24 DIAGNOSIS — S82102A Unspecified fracture of upper end of left tibia, initial encounter for closed fracture: Secondary | ICD-10-CM | POA: Diagnosis not present

## 2022-05-24 NOTE — Progress Notes (Signed)
HD#0 SUBJECTIVE:  Patient Summary: Kristine [name] is a 88 y.o. female with a history of HTN, HLD, GERD, OSA with nocturnal hypoxemia on 2 L O2 nightly, arthritis, and osteoporosis who presented after a mechanical fall, admitted for proximal tibial fracture.   Overnight Events: None  Interim History: Patient is pleasant, resting in bed this morning. She tells our team that she was able to stand up beside her bed this morning and did experience some pain, though not as bad as she has previously. I applauded her on her decision to go to SNF at discharge tomorrow and encouraged her that I believe she will continue to increase her strength and confidence while there to which she was receptive. I confirmed that she will be going tomorrow to SNF.  OBJECTIVE:  Vital Signs: Vitals:   05/23/22 1323 05/23/22 2013 05/24/22 0512 05/24/22 0821  BP: 106/71 114/65 (!) 93/51 124/82  Pulse: 82 95 89 89  Resp:  17 17   Temp: 97.8 F (36.6 C) 98.5 F (36.9 C) 98.4 F (36.9 C) 98 F (36.7 C)  TempSrc: Oral   Oral  SpO2: 95% 94% 90% 94%  Weight:      Height:       Supplemental O2: Room Air SpO2: 94 % O2 Flow Rate (L/min): 2 L/min FiO2 (%): (!) 0 %  Filed Weights   05/19/22 1914 05/20/22 1306  Weight: 115.7 kg 115.7 kg    No intake or output data in the 24 hours ending 05/24/22 1049 Net IO Since Admission: -1,500 mL [05/24/22 1049]  Physical Exam: Constitutional:Elderly female resting comfortably in bed. No acute distress noted. Pulm:Normal work of breathing on room air. QMG:QQPY is warm and dry with mildly tender pitting edema to mid-shin on the L. Patient is wearing knee immobilizer at this time. Skin:Continued improvement of cellulitis over anterior distal LLE. There is 1+ pitting edema to mid-shin where there is remnant erythema and a circular area of pink, healed-appearing skin proximal to L ankle on anterior surface. Increased warmth has nearly resolved. Neuro:Alert and oriented x3.  No focal deficit noted. Psych:Pleasant mood and affect.  Patient Lines/Drains/Airways Status     Active Line/Drains/Airways     Name Placement date Placement time Site Days   Peripheral IV 05/20/22 20 G 2.5" Anterior;Proximal;Right;Upper Arm 05/20/22  2208  Arm  4   Incision (Closed) 02/18/22 Foot Right 02/18/22  1045  -- 95             ASSESSMENT/PLAN:  Assessment: Principal Problem:   Left tibial fracture Active Problems:   Fall   Plan: #Left proximal tibial fracture #History of knee replacement 2019 Patient has decided that she will indeed discharge to SNF and she has a bed at [facility] 06/07. She was wearing the knee immobilizer on exam today and reports that she tolerated standing at bedside with some, but tolerable, pain.  She is medically stable for discharge to SNF at this time, pending bed availability tomorrow. -Continue use of knee immobilizer when out of bed -Continue tylenol 1000 mg TID -Lidocaine patch for knee -Continue oxycodone 5 mg q4h -Hold opioid medications & call MD if SBP<90, HR<65, RR<10, O2<90, or altered mental status. -Continue home gabapentin -Appreciate TOC's assistance in finding safe placement and SNF placement   #Constipation Patient is at increased risk of constipation due to combination of opioid medication and immobilization. She did receive a soap suds enema yesterday and I note a BM was recorded. -Continue Senokot-S 2 tablets  twice daily -Continue daily Miralax   #Nonpurulent cellulitis of LLE Cellulitis continues to show improvement with antibiotic therapy and is remarkably improved from admission. Today is the final day of antibiotic therapy.  -Continue doxycycline 100mg  twice daily, last day today   #Chronic kidney disease IIIb Renal function stable on morning labs. -Trend BMP   #Hypertension Patient is normotensive at this time, in the setting of holding home antihypertensives. -Continue to hold home  antihypertensives  Best Practice: Diet: Regular diet IVF: None VTE: enoxaparin (LOVENOX) injection 40 mg Start: 05/19/22 1800 Code: DNR AB: Doxycycline 100 mg BID, day 5/5 Therapy Recs: SNF DISPO: Anticipated discharge in 1 days to Skilled nursing facility pending  bed availability.  Signature: 07/19/22, D.O.  Internal Medicine Resident, PGY-1 [facility]  Pager: [phone] 10:49 AM, 05/24/2022   Please contact the on call pager after 5 pm and on weekends at [phone].

## 2022-05-24 NOTE — Plan of Care (Signed)
  Problem: Education: Goal: Knowledge of the prescribed therapeutic regimen will improve Outcome: Progressing   Problem: Pain Management: Goal: Pain level will decrease with appropriate interventions Outcome: Progressing   Problem: Education: Goal: Knowledge of General Education information will improve Description: Including pain rating scale, medication(s)/side effects and non-pharmacologic comfort measures Outcome: Progressing   Problem: Health Behavior/Discharge Planning: Goal: Ability to manage health-related needs will improve Outcome: Progressing   

## 2022-05-24 NOTE — Progress Notes (Signed)
Occupational Therapy Treatment Patient Details Name: Kristine [name] MRN: [id number] DOB: December 25, 1930 Today's Date: 05/24/2022   History of present illness Pt is an 88 y/o female admitted secondary to fall. Found to have L tibia fx around TKA hardware. PMH includes R foot surgery, HTN, and CKD.   OT comments  Pt at this time agreeing to go to SNF level of therapy post dc. Pt at this time was able to complete sit to stand transfers with max assistance with multiple attempts and max standing tolerance with FW of 12 seconds. Pt then was able to complete lateral scooting to [facility] with min guard. Pt was able to sit EOB with no LOB with resting and with dynamic movements. Pt currently with functional limitations due to the deficits listed below (see OT Problem List).  Pt will benefit from skilled OT to increase their safety and independence with ADL and functional mobility for ADL to facilitate discharge to venue listed below.     Recommendations for follow up therapy are one component of a multi-disciplinary discharge planning process, led by the attending physician.  Recommendations may be updated based on patient status, additional functional criteria and insurance authorization.    Follow Up Recommendations  Skilled nursing-short term rehab (<3 hours/day)    Assistance Recommended at Discharge Frequent or constant Supervision/Assistance  Patient can return home with the following  Two people to help with walking and/or transfers;Two people to help with bathing/dressing/bathroom;Assistance with cooking/housework;Direct supervision/assist for medications management;Direct supervision/assist for financial management;Assist for transportation;Help with stairs or ramp for entrance   Equipment Recommendations  Wheelchair cushion (measurements OT);Wheelchair (measurements OT);Hospital bed;Other (comment)    Recommendations for Other Services      Precautions / Restrictions Precautions Precautions:  Fall;Other (comment) Precaution Comments: No ROM L knee Required Braces or Orthoses: Knee Immobilizer - Left Knee Immobilizer - Left: Other (comment) Restrictions Weight Bearing Restrictions: Yes LLE Weight Bearing: Weight bearing as tolerated Other Position/Activity Restrictions: WBAT in KI       Mobility Bed Mobility Overal bed mobility: Needs Assistance Bed Mobility: Supine to Sit, Sit to Supine Rolling: Min assist   Supine to sit: HOB elevated, Min guard Sit to supine: Min assist   General bed mobility comments: for assist of LLE    Transfers Overall transfer level: Needs assistance Equipment used: Standard walker Transfers: Sit to/from Stand Sit to Stand: Max assist, From elevated surface           General transfer comment: attmepeted multiple attempts but as increased was able to clear the bed then able to increase to standing to 12 seconds max     Balance Overall balance assessment: Needs assistance Sitting-balance support: No upper extremity supported, Feet supported Sitting balance-Leahy Scale: Good     Standing balance support: Bilateral upper extremity supported Standing balance-Leahy Scale: Zero                             ADL either performed or assessed with clinical judgement   ADL Overall ADL's : Needs assistance/impaired Eating/Feeding: Set up;Bed level   Grooming: Set up;Brushing hair;Sitting   Upper Body Bathing: Set up;Cueing for safety;Sitting   Lower Body Bathing: Maximal assistance;Cueing for safety;Cueing for sequencing;Bed level   Upper Body Dressing : Set up;Cueing for safety;Cueing for sequencing;Sitting   Lower Body Dressing: Maximal assistance       Toileting- Clothing Manipulation and Hygiene: Total assistance;Bed level         General  ADL Comments: Pt able to increase in transfer level but required max asisstance fro multiple trials with max standing tolerance of 12 sec    Extremity/Trunk Assessment Upper  Extremity Assessment Upper Extremity Assessment: Overall WFL for tasks assessed   Lower Extremity Assessment Lower Extremity Assessment: Defer to PT evaluation        Vision       Perception     Praxis      Cognition Arousal/Alertness: Awake/alert Behavior During Therapy: WFL for tasks assessed/performed Overall Cognitive Status: Impaired/Different from baseline Area of Impairment: Orientation                 Orientation Level: Time             General Comments: Pt was reporting it was in the evening but increase in opening blinds noted increase in level of awareness        Exercises      Shoulder Instructions       General Comments      Pertinent Vitals/ Pain       Pain Assessment Pain Assessment: Faces Faces Pain Scale: Hurts little more Pain Location: L knee Pain Descriptors / Indicators: Guarding, Grimacing Pain Intervention(s): Limited activity within patient's tolerance, Repositioned  Home Living                                          Prior Functioning/Environment              Frequency  Min 2X/week        Progress Toward Goals  OT Goals(current goals can now be found in the care plan section)  Progress towards OT goals: Progressing toward goals  Acute Rehab OT Goals Patient Stated Goal: to keep going in a posative direction OT Goal Formulation: With patient Time For Goal Achievement: 06/03/22 Potential to Achieve Goals: Fair ADL Goals Pt Will Perform Lower Body Bathing: with mod assist;sitting/lateral leans;sit to/from stand Pt Will Perform Lower Body Dressing: with mod assist;sitting/lateral leans;sit to/from stand Pt Will Transfer to Toilet: with mod assist;stand pivot transfer Pt Will Perform Toileting - Clothing Manipulation and hygiene: with mod assist;sitting/lateral leans;sit to/from stand Additional ADL Goal #1: Pt will complete all aspects of bed mobility with min A. Additional ADL Goal #2: Pt  will tolerate sitting EOB for 3-5 mins to complete grooming tasks.  Plan Discharge plan remains appropriate    Co-evaluation                 AM-PAC OT "6 Clicks" Daily Activity     Outcome Measure   Help from another person eating meals?: A Little Help from another person taking care of personal grooming?: A Little Help from another person toileting, which includes using toliet, bedpan, or urinal?: Total Help from another person bathing (including washing, rinsing, drying)?: A Lot Help from another person to put on and taking off regular upper body clothing?: A Little Help from another person to put on and taking off regular lower body clothing?: Total 6 Click Score: 13    End of Session Equipment Utilized During Treatment: Left knee immobilizer;Gait belt;Rolling walker (2 wheels)  OT Visit Diagnosis: Unsteadiness on feet (R26.81);Other abnormalities of gait and mobility (R26.89);Muscle weakness (generalized) (M62.81);Pain Pain - Right/Left: Left Pain - part of body: Leg   Activity Tolerance Patient tolerated treatment well   Patient Left in bed;with call bell/phone  within reach;with bed alarm set   Nurse Communication Mobility status        Time: [phone] OT Time Calculation (min): 35 min  Charges: OT General Charges $OT Visit: 1 Visit OT Treatments $Self Care/Home Management : 23-37 mins  Alphia Moh OTR/L  Acute Rehab Services  [phone] office number [phone] pager number   Alphia Moh 05/24/2022, 12:22 PM

## 2022-05-25 DIAGNOSIS — S82102A Unspecified fracture of upper end of left tibia, initial encounter for closed fracture: Secondary | ICD-10-CM | POA: Diagnosis not present

## 2022-05-25 LAB — CBC
HCT: 27.2 % — ABNORMAL LOW (ref 36.0–46.0)
Hemoglobin: 8.7 g/dL — ABNORMAL LOW (ref 12.0–15.0)
MCH: 31.4 pg (ref 26.0–34.0)
MCHC: 32 g/dL (ref 30.0–36.0)
MCV: 98.2 fL (ref 80.0–100.0)
Platelets: 197 10*3/uL (ref 150–400)
RBC: 2.77 MIL/uL — ABNORMAL LOW (ref 3.87–5.11)
RDW: 13.2 % (ref 11.5–15.5)
WBC: 6 10*3/uL (ref 4.0–10.5)
nRBC: 0 % (ref 0.0–0.2)

## 2022-05-25 LAB — BASIC METABOLIC PANEL
Anion gap: 6 (ref 5–15)
BUN: 27 mg/dL — ABNORMAL HIGH (ref 8–23)
CO2: 31 mmol/L (ref 22–32)
Calcium: 9.1 mg/dL (ref 8.9–10.3)
Chloride: 100 mmol/L (ref 98–111)
Creatinine, Ser: 1.32 mg/dL — ABNORMAL HIGH (ref 0.44–1.00)
GFR, Estimated: 39 mL/min — ABNORMAL LOW (ref >60)
Glucose, Bld: 95 mg/dL (ref 70–99)
Potassium: 4.9 mmol/L (ref 3.5–5.1)
Sodium: 137 mmol/L (ref 135–145)

## 2022-05-25 MED ORDER — ASPIRIN 81 MG PO CHEW
81.0000 mg | CHEWABLE_TABLET | Freq: Every day | ORAL | Status: DC
Start: 2022-05-25 — End: 2022-05-26
  Administered 2022-05-25 – 2022-05-26 (×2): 81 mg via ORAL
  Filled 2022-05-25 (×2): qty 1

## 2022-05-25 NOTE — Plan of Care (Signed)
?  Problem: Education: ?Goal: Knowledge of the prescribed therapeutic regimen will improve ?Outcome: Progressing ?  ?Problem: Activity: ?Goal: Ability to increase mobility will improve ?Outcome: Progressing ?  ?Problem: Pain Management: ?Goal: Pain level will decrease with appropriate interventions ?Outcome: Progressing ?  ?Problem: Skin Integrity: ?Goal: Will show signs of wound healing ?Outcome: Progressing ?  ?

## 2022-05-25 NOTE — Progress Notes (Signed)
Physical Therapy Treatment Patient Details Name: Kristine [name] MRN: [id number] DOB: [birth year] Today's Date: 05/25/2022   History of Present Illness Pt is an 88 y/o female admitted secondary to fall. Found to have L tibia fx around TKA hardware. PMH includes R foot surgery, HTN, and CKD.    PT Comments    Pt received in supine, agreeable to therapy session with good participation and tolerance for transfer training. Pt self-reporting some confusion/difficulty assessing what is real and what is a dream. Pt needing up to maxA for sit<>stand to RW and for lateral seated scoot transfer from bed to drop arm chair. Pt unable to take steps toward chair while standing with RW support due to LLE pain/weakness, RN notified pt will need +2 for lateral seated scooting return transfer to bed from chair vs mechanical lift. Pt continues to benefit from PT services to progress toward functional mobility goals.    Recommendations for follow up therapy are one component of a multi-disciplinary discharge planning process, led by the attending physician.  Recommendations may be updated based on patient status, additional functional criteria and insurance authorization.  Follow Up Recommendations  Skilled nursing-short term rehab (<3 hours/day)     Assistance Recommended at Discharge Frequent or constant Supervision/Assistance  Patient can return home with the following Two people to help with walking and/or transfers;Two people to help with bathing/dressing/bathroom;Assistance with cooking/housework;Help with stairs or ramp for entrance;Assist for transportation   Equipment Recommendations  Hospital bed;Other (comment)    Recommendations for Other Services       Precautions / Restrictions Precautions Precautions: Fall;Other (comment) Precaution Comments: No ROM L knee Required Braces or Orthoses: Knee Immobilizer - Left Knee Immobilizer - Left: Other (comment) Restrictions Weight Bearing  Restrictions: Yes LLE Weight Bearing: Weight bearing as tolerated Other Position/Activity Restrictions: WBAT in KI (new KI seen in room and adjusted for proper fit, pt reports this is more comfortable)     Mobility  Bed Mobility Overal bed mobility: Needs Assistance Bed Mobility: Supine to Sit, Sit to Supine Rolling: Min guard   Supine to sit: HOB elevated, Min assist     General bed mobility comments: increased time/effort, use of bed rail, minA to advance hips toward EOB    Transfers Overall transfer level: Needs assistance Equipment used: Rolling walker (2 wheels), None Transfers: Sit to/from Stand, Bed to chair/wheelchair/BSC Sit to Stand: Max assist, From elevated surface          Lateral/Scoot Transfers: Max assist, From elevated surface General transfer comment: from EOB needing 2 attempts to reach upright, mod cues for sequencing/technique and RW lowered 1 click, pt able to stand ~1 minute and took 1 small shuffled step with each LE prior to needing to sit due to pain/fatigue. Pt performed seated scoot toward R side with transfer pad assist and maxA to drop arm recliner.    Ambulation/Gait             Pre-gait activities: single shuffled step toward R/posterior with RW and each LE prior to sitting; too painful to attempt more steps for pivot transfer.       Balance Overall balance assessment: Needs assistance Sitting-balance support: No upper extremity supported, Feet supported Sitting balance-Leahy Scale: Good     Standing balance support: Bilateral upper extremity supported Standing balance-Leahy Scale: Poor Standing balance comment: maxA +1 for static standing ~1 minute, pain limited              Cognition Arousal/Alertness: Awake/alert Behavior During Therapy: [facility] for  tasks assessed/performed Overall Cognitive Status: Impaired/Different from baseline Area of Impairment: Orientation                 Orientation Level: Time              General Comments: Pt reports "I was with my family and my husband but he (has passed away), it must have been a dream."        Exercises Other Exercises Other Exercises: LLE AROM: ankle pumps, SLR (with KI donned) x10 reps ea Other Exercises: STS x 2 trials for strengthening    General Comments General comments (skin integrity, edema, etc.): Noted pt in wet bed, mesh briefs donned with pad and new bed pad in chair, bed sheets removed to protect pt skin for return transfer, RN notified. SpO2 92-94% with exertion, slight wheeze; HR 70's-80's bpm      Pertinent Vitals/Pain Pain Assessment Pain Assessment: Faces Faces Pain Scale: Hurts little more Pain Location: L knee Pain Descriptors / Indicators: Guarding, Grimacing Pain Intervention(s): Monitored during session, Repositioned, Limited activity within patient's tolerance           PT Goals (current goals can now be found in the care plan section) Acute Rehab PT Goals Patient Stated Goal: to move better and go home PT Goal Formulation: With patient Time For Goal Achievement: 06/03/22 Progress towards PT goals: Progressing toward goals    Frequency    Min 3X/week      PT Plan Current plan remains appropriate       AM-PAC PT "6 Clicks" Mobility   Outcome Measure  Help needed turning from your back to your side while in a flat bed without using bedrails?: A Little Help needed moving from lying on your back to sitting on the side of a flat bed without using bedrails?: A Little Help needed moving to and from a bed to a chair (including a wheelchair)?: A Lot Help needed standing up from a chair using your arms (e.g., wheelchair or bedside chair)?: A Lot Help needed to walk in hospital room?: Total Help needed climbing 3-5 steps with a railing? : Total 6 Click Score: 12    End of Session Equipment Utilized During Treatment: Left knee immobilizer;Gait belt Activity Tolerance: Patient tolerated treatment well Patient  left: in chair;with call bell/phone within reach;with chair alarm set Nurse Communication: Mobility status;Need for lift equipment;Other (comment) (lateral seated scoot transfer with +2 assist vs maxi-move for return to bed from chair.) PT Visit Diagnosis: Other abnormalities of gait and mobility (R26.89);Unsteadiness on feet (R26.81);Muscle weakness (generalized) (M62.81);Difficulty in walking, not elsewhere classified (R26.2);Pain Pain - Right/Left: Left Pain - part of body: Knee     Time: 9030-0923 PT Time Calculation (min) (ACUTE ONLY): 41 min  Charges:  $Therapeutic Exercise: 8-22 mins $Therapeutic Activity: 23-37 mins                     Carless Slatten P., PTA Acute Rehabilitation Services Secure Chat Preferred 9a-5:30pm Office: [phone]    Dorathy Kinsman [facility] 05/25/2022, 4:10 PM

## 2022-05-25 NOTE — Discharge Planning (Signed)
Pharmacy student rounding with the IMTS/[facility] Service. Performing daily medication review and review of current laboratory data.  May consider post-tibial fracture thrombophylaxis for DVT per the following:  [email or web address]  "Fixation of the majority of distal femoral fractures and tibial plateau fractures are probably not dissimilar in terms of the magnitude of the surgery to primary knee arthroplasty, although the population demographics will differ. Based on knee arthroplasty surgery, there is evidence to support the following regimen of thrombophylaxis options: Aspirin for 14 days; or LMWH for 14 days; or Rivaroxaban"  Discussed treatment with team. The team has decided on aspirin for 14 days and informed the patient of decision.  [place], [employer] Pharmacy Student

## 2022-05-25 NOTE — Progress Notes (Signed)
HD#0 SUBJECTIVE:  Patient Summary: Kristine [name] is a 88 y.o. female with a history of HTN, HLD, GERD, OSA with nocturnal hypoxemia on 2 L O2 nightly, arthritis, and osteoporosis who presented after a mechanical fall, admitted for proximal tibial fracture.   Overnight Events: None  Interim History: Patient appeared well this morning and is excited about having worked well with PT yesterday. She does have a burning sensation on the lateral distal LLE where the knee immobilizer was and I explained that she could have this off while resting in bed.  OBJECTIVE:  Vital Signs: Vitals:   05/24/22 2021 05/25/22 0525 05/25/22 0751 05/25/22 0845  BP: 108/78 97/72 119/74   Pulse: 87 76 78   Resp: 17 14 19    Temp: 98.2 F (36.8 C) 98.6 F (37 C) 98.2 F (36.8 C)   TempSrc: Oral Oral Oral   SpO2: 96% 91% 90% 95%  Weight:      Height:       Supplemental O2: Room Air SpO2: 95 % O2 Flow Rate (L/min): 2 L/min FiO2 (%): (!) 0 %  Filed Weights   05/19/22 1914 05/20/22 1306  Weight: 115.7 kg 115.7 kg     Intake/Output Summary (Last 24 hours) at 05/25/2022 1322 Last data filed at 05/25/2022 1000 Gross per 24 hour  Intake 600 ml  Output --  Net 600 ml   Net IO Since Admission: -900 mL [05/25/22 1322]  Physical Exam: Constitutional:Elderly female resting comfortably in bed, in no acute distress. Pulm:Normal work of breathing on room air. 07/25/22 all 4 extremities spontaneously. Skin:Persistence of erythema and a circular area of pink, healed-appearing skin proximal to L ankle on anterior surface. Increased warmth has nearly resolved. Bruising from her fall present on lateral distal LLE with tenderness over the area.  Neuro:Alert and oriented x3. No focal deficit noted. Psych:Pleasant mood and affect.  Patient Lines/Drains/Airways Status     Active Line/Drains/Airways     Name Placement date Placement time Site Days   Peripheral IV 05/20/22 20 G 2.5"  Anterior;Proximal;Right;Upper Arm 05/20/22  2208  Arm  5   Incision (Closed) 02/18/22 Foot Right 02/18/22  1045  -- 96             ASSESSMENT/PLAN:  Assessment: Principal Problem:   Left tibial fracture Active Problems:   Fall   Plan: #Left proximal tibial fracture #History of knee replacement 2019 Patient is medically stable for discharge to SNF at this time, pending insurance authorization. She continues to work with PT. She does not report pain this morning. -Continue use of knee immobilizer when out of bed -Continue tylenol 1000 mg TID -Lidocaine patch for knee.distal LLE. -Continue oxycodone 5 mg q4h -Hold opioid medications & call MD if SBP<90, HR<65, RR<10, O2<90, or altered mental status. -Continue home gabapentin -Appreciate TOC's assistance in finding safe placement and SNF placement   #Constipation Patient is at increased risk of constipation due to combination of opioid medication and immobilization. -Continue Senokot-S 2 tablets twice daily -Continue daily Miralax   #Nonpurulent cellulitis of LLE Cellulitis continues to show improvement with antibiotic therapy and is remarkably improved from admission. She completed 5-day course of doxycycline 06/06.   #Chronic kidney disease IIIb Renal function stable on morning labs. -Trend BMP   #Hypertension Patient is normotensive at this time, in the setting of holding home antihypertensives. -Continue to hold home antihypertensives   Best Practice: Diet: Regular diet IVF: None VTE: enoxaparin (LOVENOX) injection 40 mg Start: 05/19/22 1800 Code: DNR  AB: Doxycycline 100 mg BID, day 5/5 Therapy Recs: SNF DISPO: Anticipated discharge in 1 days to Skilled nursing facility pending  bed availability.    Signature: [facility], D.O.  Internal Medicine Resident, PGY-1 [facility]  Pager: [phone] 1:22 PM, 05/25/2022   Please contact the on call pager after 5 pm and on weekends at  [phone].

## 2022-05-26 ENCOUNTER — Other Ambulatory Visit (HOSPITAL_COMMUNITY): Payer: Self-pay

## 2022-05-26 DIAGNOSIS — S82102A Unspecified fracture of upper end of left tibia, initial encounter for closed fracture: Secondary | ICD-10-CM | POA: Diagnosis not present

## 2022-05-26 LAB — BASIC METABOLIC PANEL
Anion gap: 8 (ref 5–15)
BUN: 25 mg/dL — ABNORMAL HIGH (ref 8–23)
CO2: 28 mmol/L (ref 22–32)
Calcium: 9.1 mg/dL (ref 8.9–10.3)
Chloride: 101 mmol/L (ref 98–111)
Creatinine, Ser: 1.41 mg/dL — ABNORMAL HIGH (ref 0.44–1.00)
GFR, Estimated: 36 mL/min — ABNORMAL LOW (ref >60)
Glucose, Bld: 112 mg/dL — ABNORMAL HIGH (ref 70–99)
Potassium: 4.2 mmol/L (ref 3.5–5.1)
Sodium: 137 mmol/L (ref 135–145)

## 2022-05-26 MED ORDER — OXYCODONE HCL 5 MG PO TABS: 5.0000 mg | ORAL_TABLET | ORAL | 0 refills | Status: AC

## 2022-05-26 MED ORDER — ASPIRIN 81 MG PO CHEW
81.0000 mg | CHEWABLE_TABLET | Freq: Every day | ORAL | 0 refills | Status: AC
  Filled 2022-05-26: qty 14, 14d supply, fill #0

## 2022-05-26 NOTE — TOC Transition Note (Signed)
Transition of Care [facility]) - CM/SW Discharge Note   Patient Details  Name: Kristine [name] MRN: [id number] Date of Birth: 03-May-1931  Transition of Care [facility]) CM/SW Contact:  Lorri Frederick, LCSW Phone Number: 05/26/2022, 9:37 AM   Clinical Narrative:   Pt discharging to [facility].  RN call [phone] for report.     0830: TC Sheila/[facility].  They received auth for pt and are ready for her.  MD informed.    Final next level of care: Skilled Nursing Facility Barriers to Discharge: Barriers Resolved   Patient Goals and CMS Choice Patient states their goals for this hospitalization and ongoing recovery are:: rehab then home CMS Medicare.gov Compare Post Acute Care list provided to:: Patient Choice offered to / list presented to : [facility] POA / Guardian, Patient  Discharge Placement              Patient chooses bed at: [facility] Patient to be transferred to facility by: [facility] Name of family member notified: nephew Dell Patient and family notified of of transfer: 05/26/22  Discharge Plan and Services In-house Referral: Clinical Social Work Discharge Planning Services: [employer] Consult Post Acute Care Choice: Horticulturist, commercial, Home Health          DME Arranged: Community education officer wheelchair with seat cushion DME Agency: [employer] Date DME Agency Contacted: 05/21/22 Time DME Agency Contacted: 1331 Representative spoke with at DME Agency: [facility] HH Arranged: PT, OT, Nurse's Aide, Social Work [employer] Agency: [facility] Date [facility] Agency Contacted: 05/21/22 Time HH Agency Contacted: [id number] Representative spoke with at [facility] Agency: Kandee Keen  Social Determinants of Health (SDOH) Interventions     Readmission Risk Interventions     No data to display

## 2022-05-26 NOTE — Plan of Care (Signed)
  Problem: Physical Regulation: Goal: Postoperative complications will be avoided or minimized Outcome: Progressing   Problem: Pain Management: Goal: Pain level will decrease with appropriate interventions Outcome: Progressing   Problem: Skin Integrity: Goal: Will show signs of wound healing Outcome: Progressing   Problem: Clinical Measurements: Goal: Respiratory complications will improve Outcome: Progressing   Problem: Activity: Goal: Risk for activity intolerance will decrease Outcome: Progressing   Problem: Safety: Goal: Ability to remain free from injury will improve Outcome: Progressing

## 2022-05-26 NOTE — Progress Notes (Signed)
AVS, signed printed prescription, social worker's paperwork, and signed DNR form placed in discharge packet. Report called and given to [facility], Charity fundraiser at [facility]. All questions answered to satisfaction. [facility] to transport pt with all belongings.

## 2022-06-07 ENCOUNTER — Telehealth: Payer: Self-pay | Admitting: Orthopedic Surgery

## 2022-06-07 NOTE — Telephone Encounter (Signed)
[facility] orders signed on 06/02/22 and faxed to [facility] on 06/06/22. Scanned into pt's file.

## 2022-06-07 NOTE — Telephone Encounter (Signed)
Kristine [name] from [facility] called requesting a call back on update of orders faxed over that need to be signed and date. Please call Kristine [name] at [phone].

## 2022-06-08 NOTE — Telephone Encounter (Signed)
[facility] informed they received fax

## 2024-02-01 IMAGING — CR DG TIBIA/FIBULA 2V*L*
3 series · 3 of 3 positions shown · non-contrast
Comparison: Frontal view of the bilateral knees 10/07/2021

CLINICAL DATA: Left knee and lower leg pain.  Fall.

EXAM:
LEFT KNEE - COMPLETE 4+ VIEW; LEFT TIBIA AND FIBULA - 2 VIEW

[tibia ap (1 of 2)]
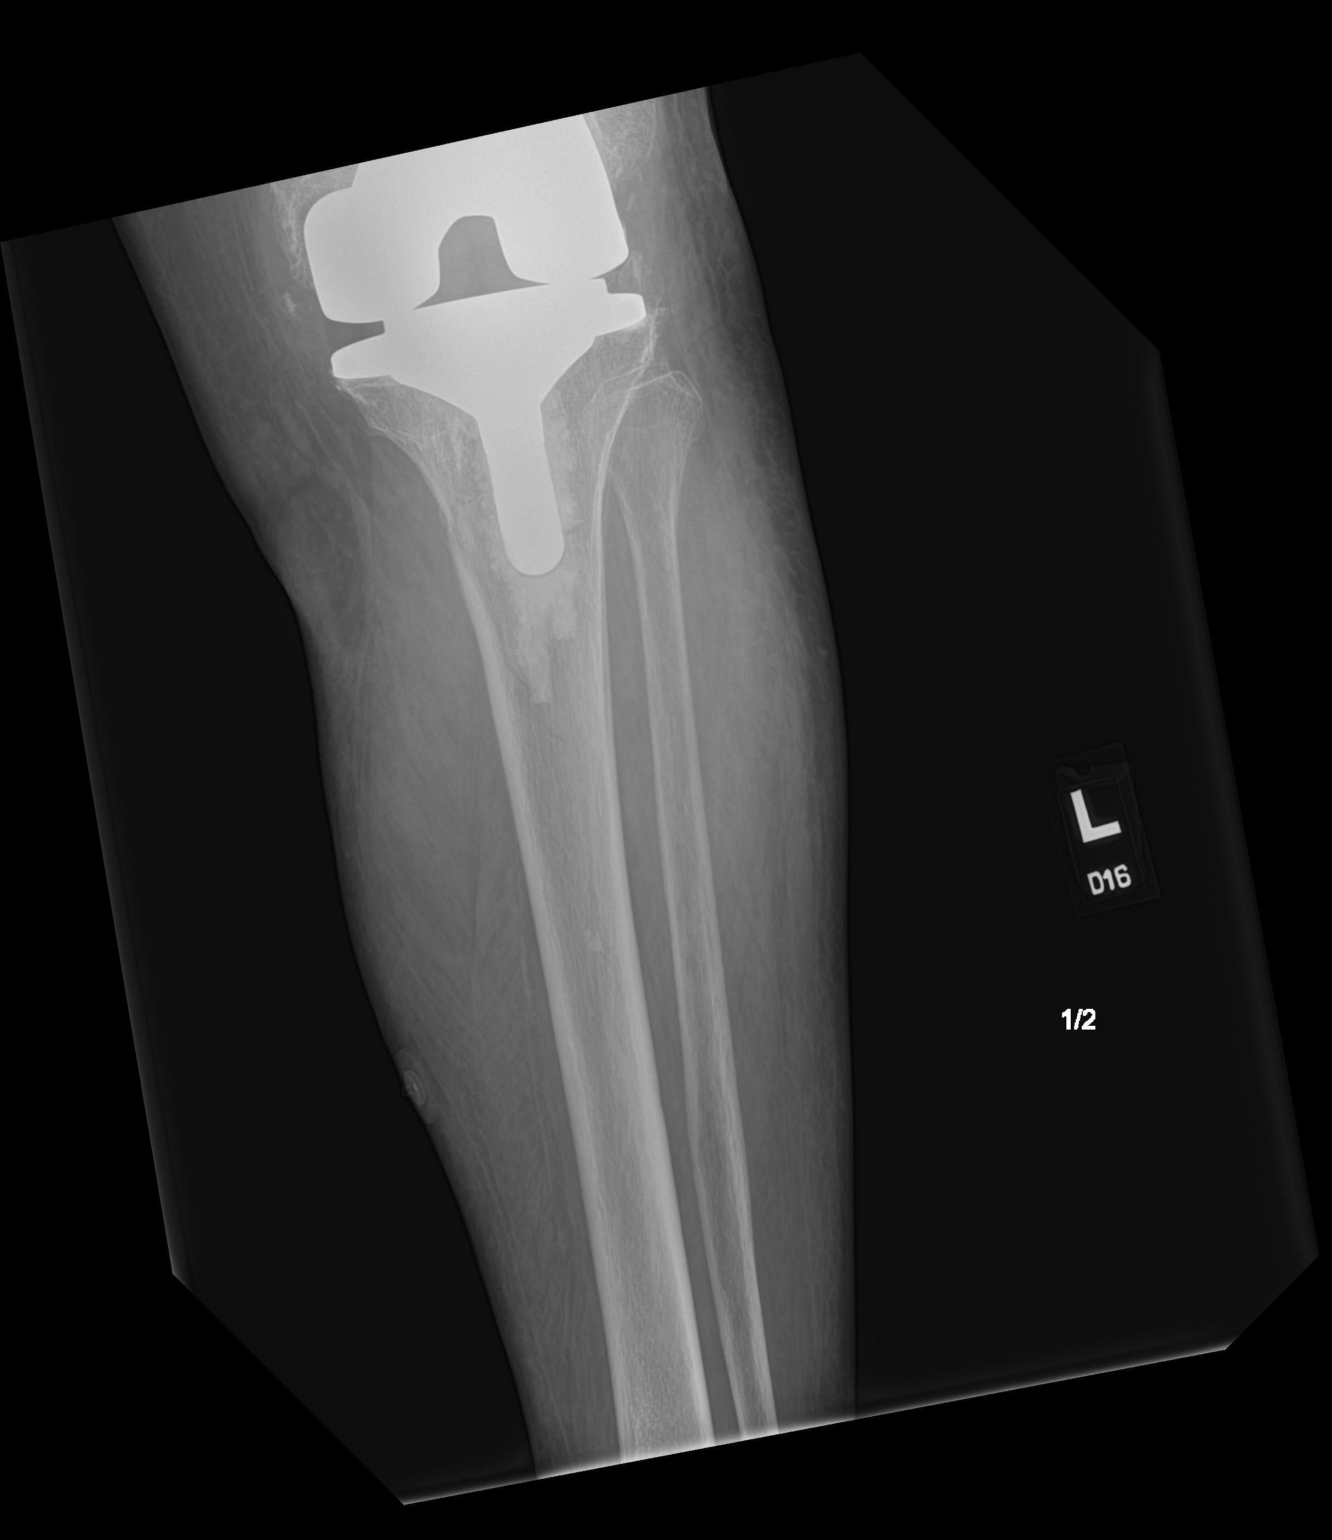

[tibia ap (2 of 2)]
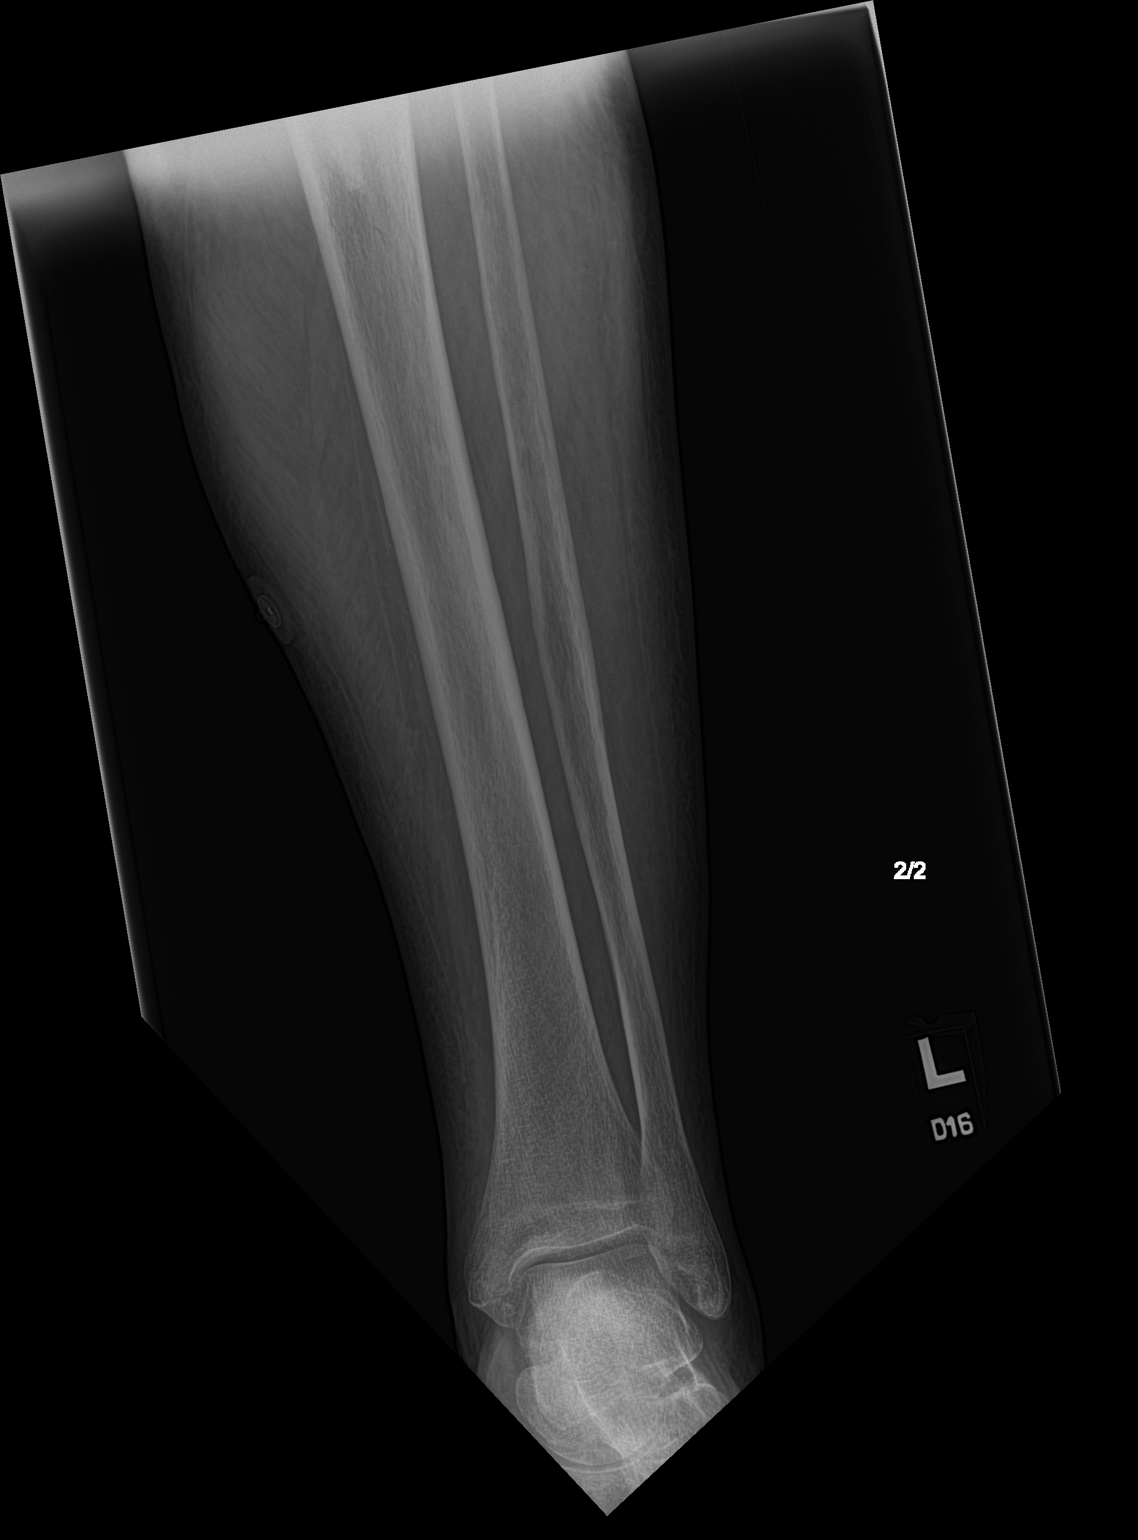

[tibia lat]
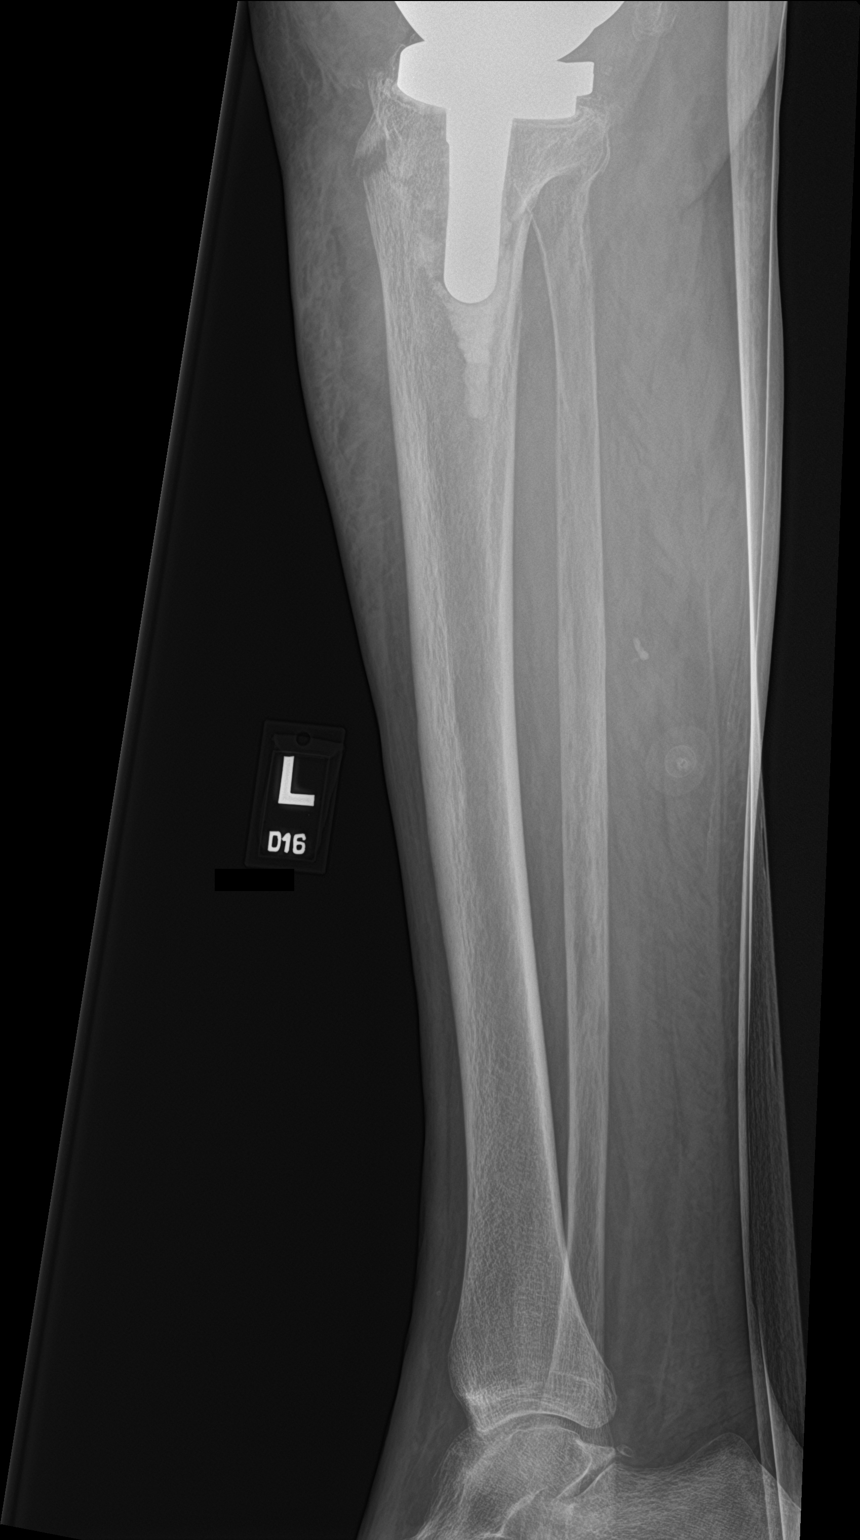

[3 of 3 positions shown; findings below may reference images not displayed]

FINDINGS: Left knee:

There is diffuse decreased bone mineralization. There is oblique
linear lucency extending from the tibial tubercle posterior
inferiorly to the anterior distal aspect of the tibial stem.
Additional oblique linear lucency overlying the proximal posterior
tibia, posterior to the distal aspect of the tibial stem.
Corresponding lucency overlying the medial proximal tibial
metadiaphysis and lateral proximal tibial metaphysis on frontal
view. This is highly suspicious for a periprosthetic fracture.

No periprosthetic fracture is seen within the distal femur. No
definite joint effusion.
IMPRESSION: Nondisplaced, acute, periprosthetic fracture of the proximal tibial
metadiaphysis.

## 2024-02-01 IMAGING — CR DG KNEE COMPLETE 4+V*L*
4 series · 4 of 4 positions shown · non-contrast
Comparison: Frontal view of the bilateral knees 10/07/2021

CLINICAL DATA: Left knee and lower leg pain.  Fall.

EXAM:
LEFT KNEE - COMPLETE 4+ VIEW; LEFT TIBIA AND FIBULA - 2 VIEW

[knee ap]
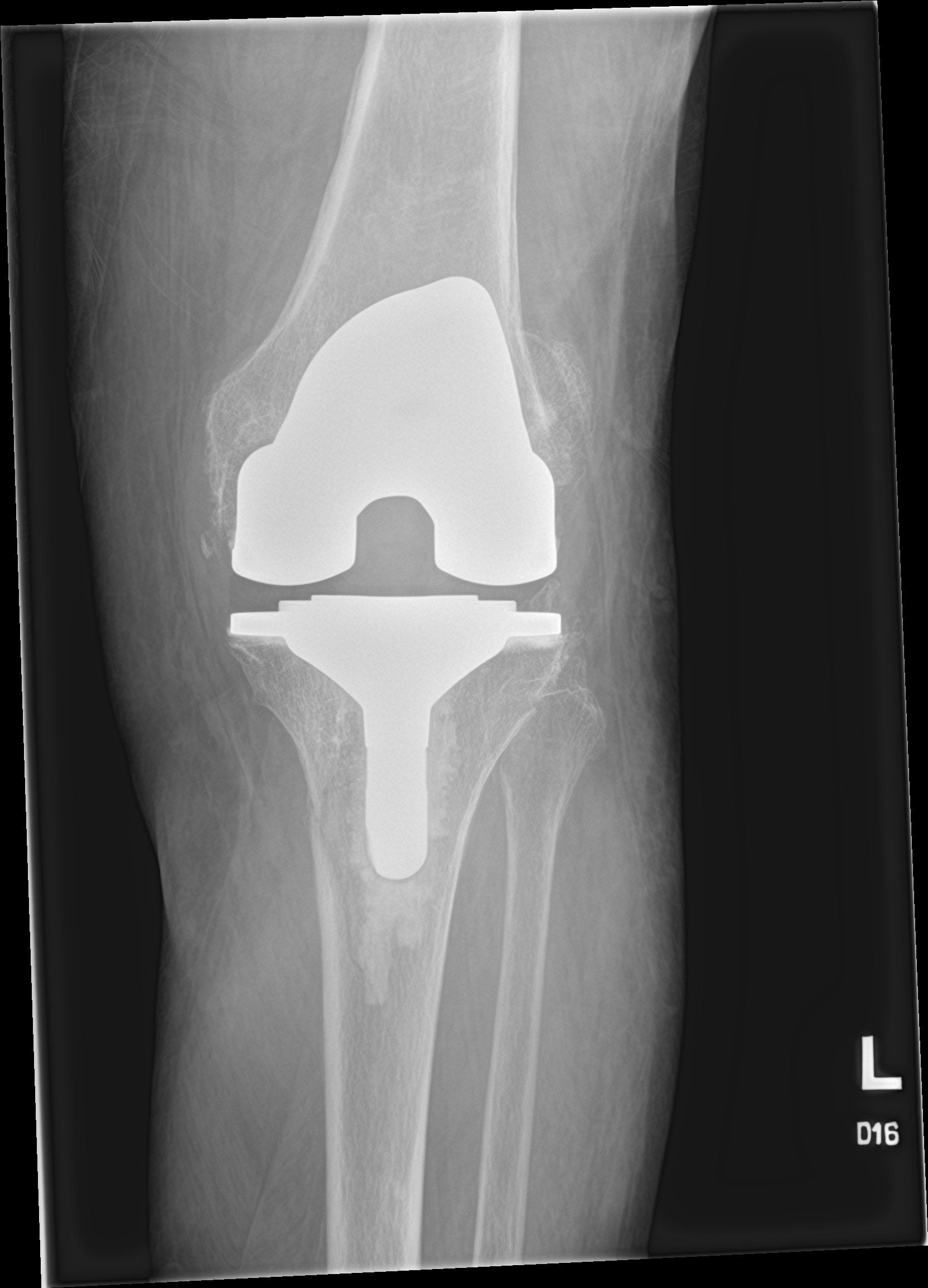

[knee lat]
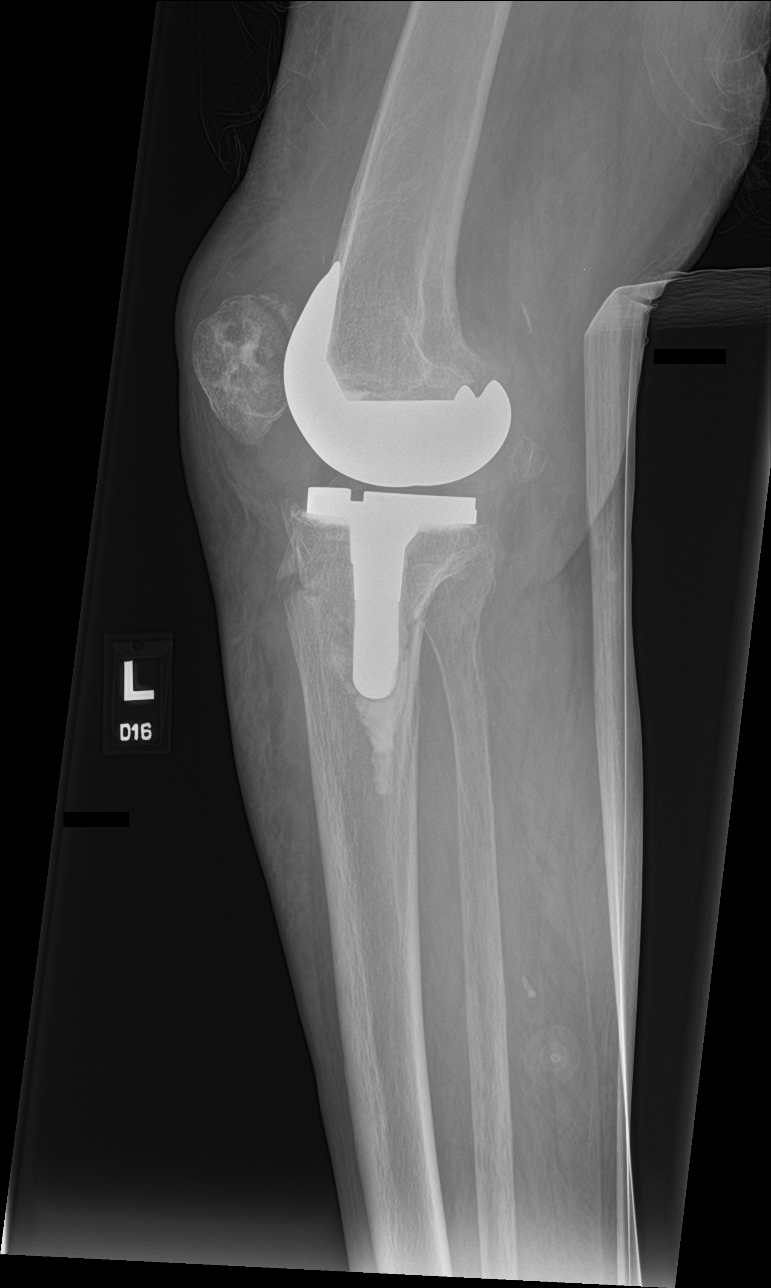

[knee obl (1 of 2)]
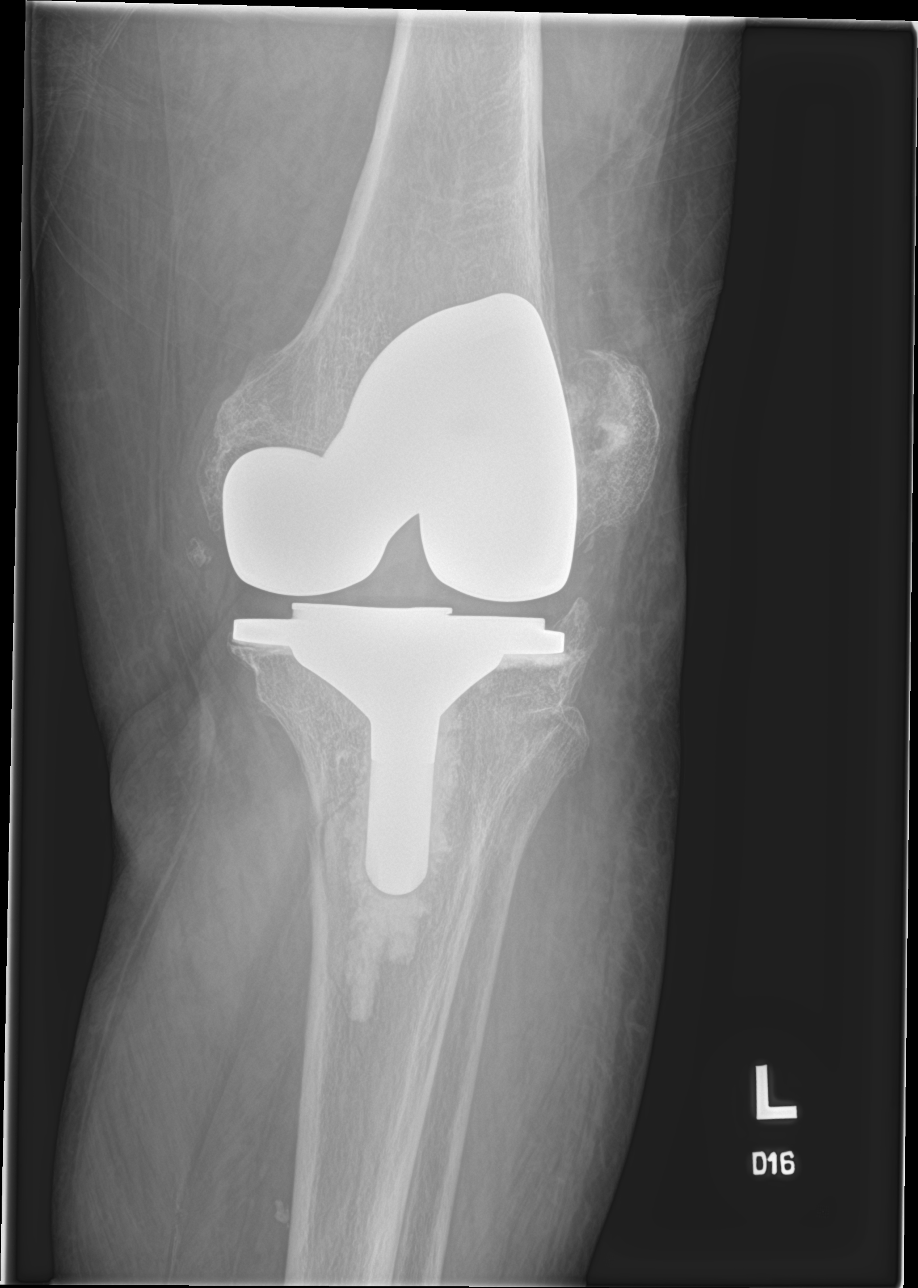

[knee obl (2 of 2)]
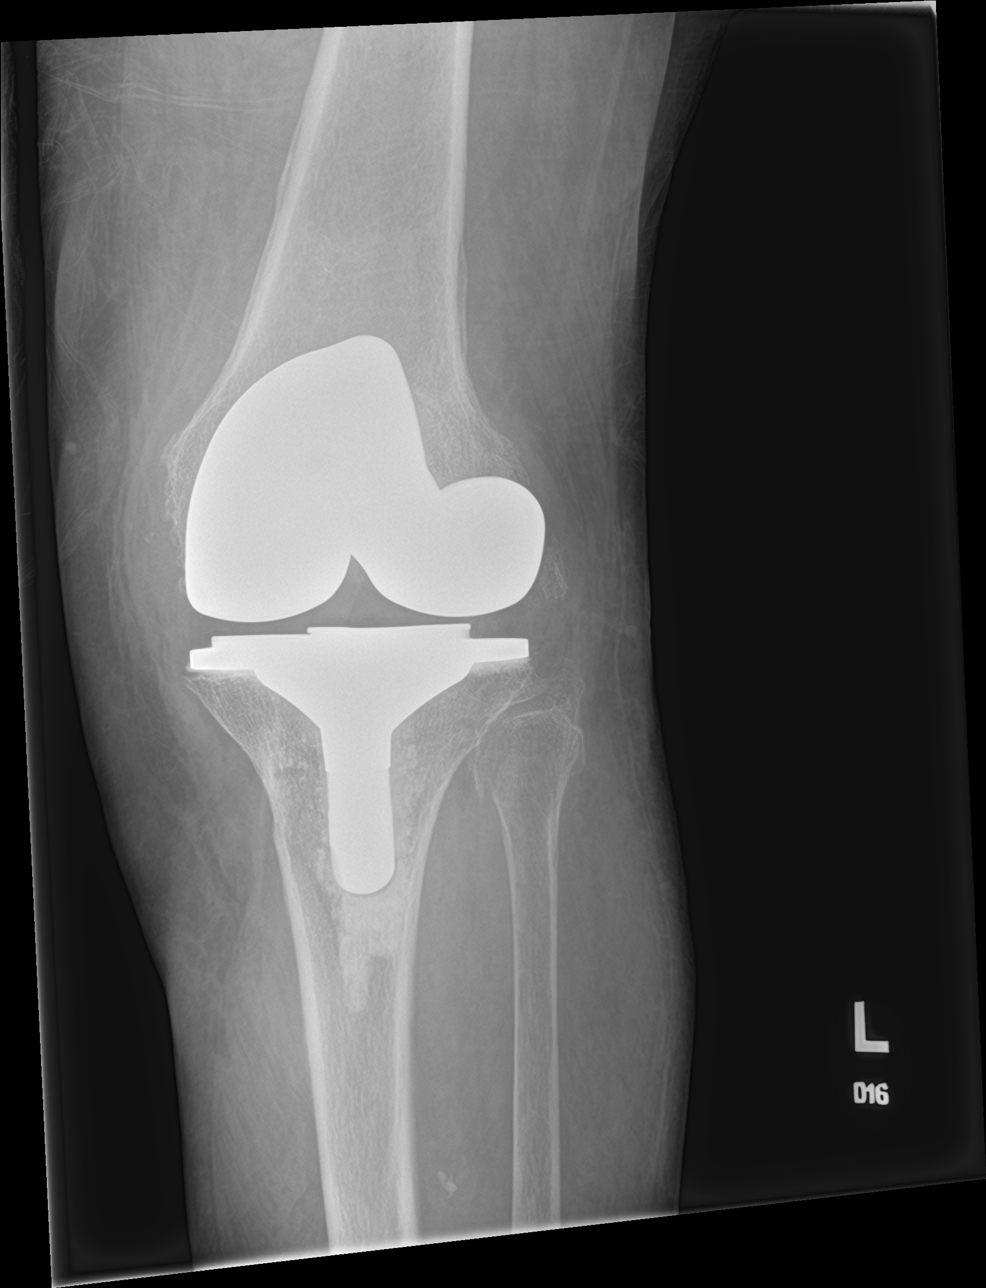

[4 of 4 positions shown; findings below may reference images not displayed]

FINDINGS: Left knee:

There is diffuse decreased bone mineralization. There is oblique
linear lucency extending from the tibial tubercle posterior
inferiorly to the anterior distal aspect of the tibial stem.
Additional oblique linear lucency overlying the proximal posterior
tibia, posterior to the distal aspect of the tibial stem.
Corresponding lucency overlying the medial proximal tibial
metadiaphysis and lateral proximal tibial metaphysis on frontal
view. This is highly suspicious for a periprosthetic fracture.

No periprosthetic fracture is seen within the distal femur. No
definite joint effusion.
IMPRESSION: Nondisplaced, acute, periprosthetic fracture of the proximal tibial
metadiaphysis.
